# Patient Record
Sex: Male | Born: 1970 | Race: Black or African American | Hispanic: No | Smoking: Current every day smoker
Health system: Southern US, Community
[De-identification: ages and names within clinical notes are randomized; demographics above are authoritative.]

## PROBLEM LIST (undated history)

## (undated) DIAGNOSIS — I10 Essential (primary) hypertension: Secondary | ICD-10-CM

## (undated) HISTORY — PX: OTHER SURGICAL HISTORY: SHX169

---

## 2000-12-20 ENCOUNTER — Inpatient Hospital Stay (HOSPITAL_COMMUNITY): Admission: AC | Admit: 2000-12-20 | Discharge: 2000-12-22 | Payer: Self-pay

## 2001-07-09 ENCOUNTER — Emergency Department (HOSPITAL_COMMUNITY): Admission: EM | Admit: 2001-07-09 | Discharge: 2001-07-10 | Payer: Self-pay | Admitting: Emergency Medicine

## 2001-07-10 ENCOUNTER — Emergency Department (HOSPITAL_COMMUNITY): Admission: EM | Admit: 2001-07-10 | Discharge: 2001-07-10 | Payer: Self-pay | Admitting: Emergency Medicine

## 2006-01-03 ENCOUNTER — Emergency Department (HOSPITAL_COMMUNITY): Admission: EM | Admit: 2006-01-03 | Discharge: 2006-01-03 | Payer: Self-pay | Admitting: Emergency Medicine

## 2006-05-22 ENCOUNTER — Emergency Department (HOSPITAL_COMMUNITY): Admission: EM | Admit: 2006-05-22 | Discharge: 2006-05-22 | Payer: Self-pay | Admitting: Emergency Medicine

## 2006-09-23 ENCOUNTER — Emergency Department (HOSPITAL_COMMUNITY): Admission: EM | Admit: 2006-09-23 | Discharge: 2006-09-23 | Payer: Self-pay | Admitting: Family Medicine

## 2008-03-16 ENCOUNTER — Emergency Department (HOSPITAL_COMMUNITY): Admission: EM | Admit: 2008-03-16 | Discharge: 2008-03-16 | Payer: Self-pay | Admitting: Emergency Medicine

## 2008-05-09 ENCOUNTER — Emergency Department (HOSPITAL_COMMUNITY): Admission: EM | Admit: 2008-05-09 | Discharge: 2008-05-09 | Payer: Self-pay | Admitting: Emergency Medicine

## 2009-04-25 ENCOUNTER — Emergency Department (HOSPITAL_COMMUNITY): Admission: EM | Admit: 2009-04-25 | Discharge: 2009-04-25 | Payer: Self-pay | Admitting: Emergency Medicine

## 2009-10-26 ENCOUNTER — Emergency Department (HOSPITAL_COMMUNITY): Admission: EM | Admit: 2009-10-26 | Discharge: 2009-10-26 | Payer: Self-pay | Admitting: Emergency Medicine

## 2009-11-07 ENCOUNTER — Ambulatory Visit (HOSPITAL_BASED_OUTPATIENT_CLINIC_OR_DEPARTMENT_OTHER): Admission: RE | Admit: 2009-11-07 | Discharge: 2009-11-07 | Payer: Self-pay | Admitting: Orthopedic Surgery

## 2009-11-11 ENCOUNTER — Emergency Department (HOSPITAL_COMMUNITY): Admission: EM | Admit: 2009-11-11 | Discharge: 2009-11-11 | Payer: Self-pay | Admitting: Emergency Medicine

## 2009-11-23 ENCOUNTER — Encounter: Admission: RE | Admit: 2009-11-23 | Discharge: 2010-01-11 | Payer: Self-pay | Admitting: Orthopedic Surgery

## 2010-01-16 ENCOUNTER — Encounter: Admission: RE | Admit: 2010-01-16 | Discharge: 2010-03-26 | Payer: Self-pay | Admitting: Orthopedic Surgery

## 2010-03-23 ENCOUNTER — Emergency Department (HOSPITAL_COMMUNITY): Admission: EM | Admit: 2010-03-23 | Discharge: 2010-03-23 | Payer: Self-pay | Admitting: Emergency Medicine

## 2010-09-13 ENCOUNTER — Emergency Department (HOSPITAL_COMMUNITY)
Admission: EM | Admit: 2010-09-13 | Discharge: 2010-09-13 | Disposition: A | Discharge status: Home / Self Care | Payer: Self-pay | Attending: Emergency Medicine | Admitting: Emergency Medicine

## 2010-09-13 ENCOUNTER — Emergency Department (HOSPITAL_COMMUNITY): Payer: Self-pay

## 2010-09-13 DIAGNOSIS — M25569 Pain in unspecified knee: Secondary | ICD-10-CM | POA: Insufficient documentation

## 2010-09-18 LAB — POCT HEMOGLOBIN-HEMACUE: Hemoglobin: 15.4 g/dL (ref 13.0–17.0)

## 2010-12-24 ENCOUNTER — Emergency Department (HOSPITAL_COMMUNITY)
Admission: EM | Admit: 2010-12-24 | Discharge: 2010-12-24 | Disposition: A | Discharge status: Home / Self Care | Payer: Self-pay | Attending: Emergency Medicine | Admitting: Emergency Medicine

## 2010-12-24 DIAGNOSIS — W540XXA Bitten by dog, initial encounter: Secondary | ICD-10-CM | POA: Insufficient documentation

## 2010-12-24 DIAGNOSIS — S61409A Unspecified open wound of unspecified hand, initial encounter: Secondary | ICD-10-CM | POA: Insufficient documentation

## 2011-01-11 ENCOUNTER — Emergency Department (HOSPITAL_COMMUNITY)
Admission: EM | Admit: 2011-01-11 | Discharge: 2011-01-11 | Disposition: A | Discharge status: Home / Self Care | Payer: Self-pay | Attending: Emergency Medicine | Admitting: Emergency Medicine

## 2011-01-11 DIAGNOSIS — S8010XA Contusion of unspecified lower leg, initial encounter: Secondary | ICD-10-CM | POA: Insufficient documentation

## 2011-01-11 DIAGNOSIS — W208XXA Other cause of strike by thrown, projected or falling object, initial encounter: Secondary | ICD-10-CM | POA: Insufficient documentation

## 2011-01-11 DIAGNOSIS — S81009A Unspecified open wound, unspecified knee, initial encounter: Secondary | ICD-10-CM | POA: Insufficient documentation

## 2011-04-02 LAB — BASIC METABOLIC PANEL
Chloride: 102
GFR calc Af Amer: >60
Potassium: 3.7
Sodium: 140

## 2011-04-02 LAB — URINALYSIS, ROUTINE W REFLEX MICROSCOPIC
Glucose, UA: NEGATIVE
Hgb urine dipstick: NEGATIVE
Protein, ur: NEGATIVE
pH: 6

## 2011-04-02 LAB — DIFFERENTIAL
Eosinophils Absolute: 0
Eosinophils Relative: 0
Lymphs Abs: 1.1
Monocytes Relative: 10

## 2011-04-02 LAB — CBC
HCT: 47.1
MCV: 95.7
RBC: 4.93
WBC: 6.6

## 2012-01-06 ENCOUNTER — Emergency Department (HOSPITAL_COMMUNITY)
Admission: EM | Admit: 2012-01-06 | Discharge: 2012-01-06 | Disposition: A | Discharge status: Home / Self Care | Payer: 59 | Attending: Emergency Medicine | Admitting: Emergency Medicine

## 2012-01-06 ENCOUNTER — Encounter (HOSPITAL_COMMUNITY): Payer: Self-pay | Admitting: *Deleted

## 2012-01-06 DIAGNOSIS — F17200 Nicotine dependence, unspecified, uncomplicated: Secondary | ICD-10-CM | POA: Insufficient documentation

## 2012-01-06 DIAGNOSIS — T7840XA Allergy, unspecified, initial encounter: Secondary | ICD-10-CM

## 2012-01-06 DIAGNOSIS — L272 Dermatitis due to ingested food: Secondary | ICD-10-CM | POA: Insufficient documentation

## 2012-01-06 MED ORDER — FAMOTIDINE 20 MG PO TABS
20.0000 mg | ORAL_TABLET | Freq: Once | ORAL | Status: AC
  Administered 2012-01-06: 20 mg via ORAL
  Filled 2012-01-06: qty 1

## 2012-01-06 MED ORDER — DEXAMETHASONE SODIUM PHOSPHATE 10 MG/ML IJ SOLN
10.0000 mg | Freq: Once | INTRAMUSCULAR | Status: AC
  Administered 2012-01-06: 10 mg via INTRAMUSCULAR
  Filled 2012-01-06: qty 1

## 2012-01-06 MED ORDER — DIPHENHYDRAMINE HCL 50 MG/ML IJ SOLN
25.0000 mg | Freq: Once | INTRAMUSCULAR | Status: AC
  Administered 2012-01-06: 25 mg via INTRAMUSCULAR
  Filled 2012-01-06: qty 1

## 2012-01-06 NOTE — ED Notes (Signed)
Pt states "I ate shrimp last night around 2000, woke up around 0300 with my legs and arms itching";pt denies edema to throat, NAD

## 2012-01-06 NOTE — ED Provider Notes (Signed)
Medical screening examination/treatment/procedure(s) were performed by non-physician practitioner and as supervising physician I was immediately available for consultation/collaboration.  Josuha Fontanez, MD 01/06/12 1555 

## 2012-01-06 NOTE — ED Provider Notes (Signed)
History     CSN: [id number]  Arrival date & time 01/06/12  1321   First MD Initiated Contact with Patient 01/06/12 1353      Chief Complaint  Patient presents with  . Allergic Reaction    (Consider location/radiation/quality/duration/timing/severity/associated sxs/prior treatment) HPI Comments: Patient reports that last evening he ate shrimp.  Shortly after eating the shrimp he felt a "funny feeling" in his throat.  He then woke up at 3 AM this morning and noticed a pruritic rash on both of his arms and both of his legs.  He has put Hydrocortisone on the area, but has not taken anything orally.  The rash has improved somewhat since onset.  He denies any swelling of his throat, lips, or tongue at this time.  No shortness of breath or wheezing.  No difficulty swallowing.    The history is provided by the patient.    History reviewed. No pertinent past medical history.  Past Surgical History  Procedure Date  . Thumb surgery     right    No family history on file.  History  Substance Use Topics  . Smoking status: Current Everyday Smoker -- 0.5 packs/day  . Smokeless tobacco: Not on file  . Alcohol Use: Yes     280 ounces per week      Review of Systems  Constitutional: Negative for fever and chills.  HENT: Negative for facial swelling, trouble swallowing, neck pain and neck stiffness.   Respiratory: Negative for chest tightness, shortness of breath and wheezing.   Cardiovascular: Negative for chest pain.  Gastrointestinal: Negative for nausea and vomiting.  Skin: Positive for rash.  Neurological: Negative for syncope.    Allergies  Review of patient's allergies indicates no known allergies.  Home Medications  No current outpatient prescriptions on file.  BP 116/89  Pulse 96  Temp 97.6 F (36.4 C) (Oral)  Wt 140 lb (63.504 kg)  SpO2 100%  Physical Exam  Nursing note and vitals reviewed. Constitutional: He is oriented to person, place, and time. He appears  well-developed and well-nourished. No distress.  HENT:  Head: Normocephalic and atraumatic.  Mouth/Throat: Oropharynx is clear and moist and mucous membranes are normal.       No sign of airway obstruction. No edema of face, eyelids, lips, tongue, uvula.Marland Kitchen Uvula midline, no nasal congestion or drooling.  Tongue not elevated. No trismus.  Neck: Trachea normal, normal range of motion and full passive range of motion without pain. Neck supple. Carotid bruit is not present. No tracheal deviation present.       No stridor  Cardiovascular: Normal rate, regular rhythm, intact distal pulses and normal pulses.        Not tachycardic  Pulmonary/Chest: Effort normal. No stridor.  Musculoskeletal: Normal range of motion.  Neurological: He is alert and oriented to person, place, and time.  Skin: Skin is warm and intact. Rash noted. No petechiae and no purpura noted. He is not diaphoretic.       Not diaphoretic. Raised erythematous welts, pruritic in nature, located on both legs. Blanchable urticaria, no petechiae or purpura.   Psychiatric: He has a normal mood and affect. His behavior is normal.    ED Course  Procedures (including critical care time)  Labs Reviewed - No data to display No results found.   1. Allergic reaction       MDM  Patient given Benadryl, Pepcid, and Decadron while in the ED.  Patient re-evaluated prior to dc, is hemodynamically stable,  in no respiratory distress, and denies the feeling of throat closing. Pt has been advised to take OTC benadryl & return to the ED if they have a mod-severe allergic rxn (s/s including throat closing, difficulty breathing, swelling of lips face or tongue). Pt is to follow up with their PCP. Pt is agreeable with plan & verbalizes understanding.        Pascal Lux [place], PA-C 01/06/12 [phone]

## 2012-01-06 NOTE — ED Notes (Addendum)
Pt states he ate shrimp yesterday and began itching soon after.  Pt denies previous h/o allergy to shrimp or shellfish.  Pt states itching won't go away.  Pt in no resp distress or compromise at this time.

## 2012-06-22 ENCOUNTER — Encounter (HOSPITAL_COMMUNITY): Payer: Self-pay | Admitting: *Deleted

## 2012-06-22 ENCOUNTER — Emergency Department (HOSPITAL_COMMUNITY)
Admission: EM | Admit: 2012-06-22 | Discharge: 2012-06-22 | Disposition: A | Discharge status: Home / Self Care | Payer: No Typology Code available for payment source | Attending: Emergency Medicine | Admitting: Emergency Medicine

## 2012-06-22 DIAGNOSIS — Y92410 Unspecified street and highway as the place of occurrence of the external cause: Secondary | ICD-10-CM | POA: Insufficient documentation

## 2012-06-22 DIAGNOSIS — F17200 Nicotine dependence, unspecified, uncomplicated: Secondary | ICD-10-CM | POA: Insufficient documentation

## 2012-06-22 DIAGNOSIS — S59909A Unspecified injury of unspecified elbow, initial encounter: Secondary | ICD-10-CM | POA: Insufficient documentation

## 2012-06-22 DIAGNOSIS — S139XXA Sprain of joints and ligaments of unspecified parts of neck, initial encounter: Secondary | ICD-10-CM | POA: Insufficient documentation

## 2012-06-22 DIAGNOSIS — S59919A Unspecified injury of unspecified forearm, initial encounter: Secondary | ICD-10-CM | POA: Insufficient documentation

## 2012-06-22 DIAGNOSIS — S339XXA Sprain of unspecified parts of lumbar spine and pelvis, initial encounter: Secondary | ICD-10-CM | POA: Insufficient documentation

## 2012-06-22 DIAGNOSIS — S161XXA Strain of muscle, fascia and tendon at neck level, initial encounter: Secondary | ICD-10-CM

## 2012-06-22 DIAGNOSIS — S6990XA Unspecified injury of unspecified wrist, hand and finger(s), initial encounter: Secondary | ICD-10-CM | POA: Insufficient documentation

## 2012-06-22 DIAGNOSIS — S39012A Strain of muscle, fascia and tendon of lower back, initial encounter: Secondary | ICD-10-CM

## 2012-06-22 DIAGNOSIS — Y9389 Activity, other specified: Secondary | ICD-10-CM | POA: Insufficient documentation

## 2012-06-22 MED ORDER — IBUPROFEN 800 MG PO TABS: 800.0000 mg | ORAL_TABLET | Freq: Three times a day (TID) | ORAL | Status: DC | PRN

## 2012-06-22 MED ORDER — CYCLOBENZAPRINE HCL 10 MG PO TABS: 10.0000 mg | ORAL_TABLET | Freq: Three times a day (TID) | ORAL | Status: DC | PRN

## 2012-06-22 MED ORDER — OXYCODONE-ACETAMINOPHEN 5-325 MG PO TABS: 1.0000 | ORAL_TABLET | Freq: Four times a day (QID) | ORAL | Status: DC | PRN
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
## 2012-09-28 ENCOUNTER — Emergency Department (HOSPITAL_COMMUNITY)
Admission: EM | Admit: 2012-09-28 | Discharge: 2012-09-28 | Disposition: A | Discharge status: Home / Self Care | Payer: 59 | Attending: Emergency Medicine | Admitting: Emergency Medicine

## 2012-09-28 ENCOUNTER — Other Ambulatory Visit: Payer: Self-pay

## 2012-09-28 ENCOUNTER — Encounter (HOSPITAL_COMMUNITY): Payer: Self-pay | Admitting: Emergency Medicine

## 2012-09-28 ENCOUNTER — Emergency Department (HOSPITAL_COMMUNITY): Payer: 59

## 2012-09-28 DIAGNOSIS — I10 Essential (primary) hypertension: Secondary | ICD-10-CM

## 2012-09-28 DIAGNOSIS — R059 Cough, unspecified: Secondary | ICD-10-CM | POA: Insufficient documentation

## 2012-09-28 DIAGNOSIS — Z79899 Other long term (current) drug therapy: Secondary | ICD-10-CM | POA: Insufficient documentation

## 2012-09-28 DIAGNOSIS — R197 Diarrhea, unspecified: Secondary | ICD-10-CM | POA: Insufficient documentation

## 2012-09-28 DIAGNOSIS — F17200 Nicotine dependence, unspecified, uncomplicated: Secondary | ICD-10-CM | POA: Insufficient documentation

## 2012-09-28 DIAGNOSIS — R062 Wheezing: Secondary | ICD-10-CM | POA: Insufficient documentation

## 2012-09-28 DIAGNOSIS — R05 Cough: Secondary | ICD-10-CM | POA: Insufficient documentation

## 2012-09-28 DIAGNOSIS — R1110 Vomiting, unspecified: Secondary | ICD-10-CM | POA: Insufficient documentation

## 2012-09-28 LAB — CBC WITH DIFFERENTIAL/PLATELET
Basophils Absolute: 0 10*3/uL (ref 0.0–0.1)
Eosinophils Relative: 1 % (ref 0–5)
HCT: 46.5 % (ref 39.0–52.0)
Hemoglobin: 16.3 g/dL (ref 13.0–17.0)
Lymphocytes Relative: 31 % (ref 12–46)
MCV: 90.6 fL (ref 78.0–100.0)
Monocytes Absolute: 0.8 10*3/uL (ref 0.1–1.0)
Monocytes Relative: 13 % — ABNORMAL HIGH (ref 3–12)
RDW: 13.5 % (ref 11.5–15.5)
WBC: 5.9 10*3/uL (ref 4.0–10.5)

## 2012-09-28 LAB — URINALYSIS, MICROSCOPIC ONLY
Leukocytes, UA: NEGATIVE
Nitrite: NEGATIVE
Protein, ur: NEGATIVE mg/dL
Specific Gravity, Urine: 1.025 (ref 1.005–1.030)
Urobilinogen, UA: 1 mg/dL (ref 0.0–1.0)

## 2012-09-28 LAB — COMPREHENSIVE METABOLIC PANEL
AST: 92 U/L — ABNORMAL HIGH (ref 0–37)
BUN: 8 mg/dL (ref 6–23)
CO2: 28 mEq/L (ref 19–32)
Calcium: 9.4 mg/dL (ref 8.4–10.5)
Creatinine, Ser: 1.01 mg/dL (ref 0.50–1.35)
GFR calc Af Amer: >90 mL/min (ref >90)
GFR calc non Af Amer: >90 mL/min (ref >90)
Glucose, Bld: 102 mg/dL — ABNORMAL HIGH (ref 70–99)
Total Bilirubin: 0.9 mg/dL (ref 0.3–1.2)

## 2012-09-28 MED ORDER — HYDROCHLOROTHIAZIDE 25 MG PO TABS: 25.0000 mg | ORAL_TABLET | Freq: Every day | ORAL | Status: DC

## 2012-09-28 MED ORDER — ZOLPIDEM TARTRATE 10 MG PO TABS: ORAL_TABLET | ORAL | Status: DC

## 2012-09-28 MED ORDER — SODIUM CHLORIDE 0.9 % IV BOLUS (SEPSIS)
500.0000 mL | Freq: Once | INTRAVENOUS | Status: AC
  Administered 2012-09-28: 500 mL via INTRAVENOUS

## 2012-09-28 MED ORDER — HYDROCOD POLST-CHLORPHEN POLST 10-8 MG/5ML PO LQCR: 5.0000 mL | Freq: Two times a day (BID) | ORAL | Status: DC | PRN

## 2012-09-28 NOTE — ED Notes (Signed)
Pt complains of cough and shortness of breath x 3 days. Pt denies fever at this time. Pt also complains of vomiting and diarrhea x 3 days.

## 2012-09-28 NOTE — Progress Notes (Signed)
[facility] ED CM noted pt with coverage but no pcp listed [facility] ED CM spoke with pt on how to obtain an in network pcp with insurance coverage via the customer service number or web site   

## 2012-09-28 NOTE — ED Provider Notes (Signed)
History     CSN: [id number]  Arrival date & time 09/28/12  1109   First MD Initiated Contact with Patient 09/28/12 1158      Chief Complaint  Patient presents with  . Shortness of Breath    HPI Pt complains of cough and shortness of breath x 3 days. Pt denies fever at this time. Pt also complains of vomiting and diarrhea x 3 days.  History reviewed. No pertinent past medical history.  Past Surgical History  Procedure Laterality Date  . Thumb surgery      right    No family history on file.  History  Substance Use Topics  . Smoking status: Current Every Day Smoker -- 0.50 packs/day  . Smokeless tobacco: Not on file  . Alcohol Use: Yes     Comment: 280 ounces per week/  today states occasionally      Review of Systems All other systems reviewed and are negative Allergies  Review of patient's allergies indicates no known allergies.  Home Medications   Current Outpatient Rx  Name  Route  Sig  Dispense  Refill  . chlorpheniramine-HYDROcodone (TUSSIONEX PENNKINETIC ER) 10-8 MG/5ML LQCR   Oral   Take 5 mLs by mouth every 12 (twelve) hours as needed.   140 mL   0   . cyclobenzaprine (FLEXERIL) 10 MG tablet   Oral   Take 1 tablet (10 mg total) by mouth 3 (three) times daily as needed for muscle spasms.   30 tablet   0   . hydrochlorothiazide (HYDRODIURIL) 25 MG tablet   Oral   Take 1 tablet (25 mg total) by mouth daily.   30 tablet   0   . ibuprofen (ADVIL,MOTRIN) 800 MG tablet   Oral   Take 800 mg by mouth every 8 (eight) hours as needed. For knee pain.         Marland Kitchen ibuprofen (ADVIL,MOTRIN) 800 MG tablet   Oral   Take 1 tablet (800 mg total) by mouth every 8 (eight) hours as needed for pain.   30 tablet   0   . oxyCODONE-acetaminophen (PERCOCET/ROXICET) 5-325 MG per tablet   Oral   Take 1-2 tablets by mouth every 6 (six) hours as needed for pain.   20 tablet   0   . zolpidem (AMBIEN) 10 MG tablet      Take 1/2-1 tablet at bedtime as needed for  sleep   30 tablet   0     BP 136/99  Pulse 97  Temp(Src) 98.6 F (37 C) (Oral)  Resp 18  SpO2 99%  Physical Exam  Nursing note and vitals reviewed. Constitutional: He is oriented to person, place, and time. He appears well-developed and well-nourished. No distress.  HENT:  Head: Normocephalic and atraumatic.  Eyes: Pupils are equal, round, and reactive to light.  Neck: Normal range of motion.  Cardiovascular: Normal rate and intact distal pulses.   Pulmonary/Chest: No respiratory distress. He has wheezes.  Abdominal: Normal appearance. He exhibits no distension. There is no tenderness. There is no rebound.  Musculoskeletal: Normal range of motion.  Neurological: He is alert and oriented to person, place, and time. No cranial nerve deficit.  Skin: Skin is warm and dry. No rash noted.  Psychiatric: He has a normal mood and affect. His behavior is normal.    ED Course  Procedures (including critical care time)  Date: 09/28/2012  Rate: 89  Rhythm: normal sinus rhythm  QRS Axis: normal  Intervals: normal  ST/T Wave abnormalities: ST changes consistent with early recall  Conduction Disutrbances: none  Narrative Interpretation: unremarkable     Labs Reviewed  CBC WITH DIFFERENTIAL - Abnormal; Notable for the following:    Monocytes Relative 13 (*)    All other components within normal limits  COMPREHENSIVE METABOLIC PANEL - Abnormal; Notable for the following:    Chloride 95 (*)    Glucose, Bld 102 (*)    AST 92 (*)    All other components within normal limits  URINALYSIS, MICROSCOPIC ONLY - Abnormal; Notable for the following:    Color, Urine AMBER (*)    All other components within normal limits  ETHANOL  TROPONIN I   Dg Chest 2 View  09/28/2012  *RADIOLOGY REPORT*  Clinical Data: Palpitations  CHEST - 2 VIEW  Comparison: May 09, 2008.  Findings: Cardiomediastinal silhouette appears normal.  No acute pulmonary disease is noted.  Bony thorax is intact.   IMPRESSION: No acute cardiopulmonary abnormality seen.   Original Report Authenticated By: Lupita Raider.,  M.D.      1. Hypertension   2. Vomiting and diarrhea       MDM         Nelia Shi, MD 09/28/12 1530
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
## 2012-10-07 ENCOUNTER — Institutional Professional Consult (permissible substitution): Payer: 59 | Admitting: Cardiology

## 2013-10-25 ENCOUNTER — Ambulatory Visit: Payer: Self-pay | Admitting: Family Medicine

## 2015-03-04 ENCOUNTER — Encounter (HOSPITAL_COMMUNITY): Payer: Self-pay

## 2015-03-04 ENCOUNTER — Emergency Department (HOSPITAL_COMMUNITY)
Admission: EM | Admit: 2015-03-04 | Discharge: 2015-03-04 | Disposition: A | Discharge status: Home / Self Care | Payer: Self-pay | Attending: Emergency Medicine | Admitting: Emergency Medicine

## 2015-03-04 DIAGNOSIS — Y9389 Activity, other specified: Secondary | ICD-10-CM | POA: Insufficient documentation

## 2015-03-04 DIAGNOSIS — S0182XA Laceration with foreign body of other part of head, initial encounter: Secondary | ICD-10-CM | POA: Insufficient documentation

## 2015-03-04 DIAGNOSIS — Y9289 Other specified places as the place of occurrence of the external cause: Secondary | ICD-10-CM | POA: Insufficient documentation

## 2015-03-04 DIAGNOSIS — IMO0002 Reserved for concepts with insufficient information to code with codable children: Secondary | ICD-10-CM

## 2015-03-04 DIAGNOSIS — W01198A Fall on same level from slipping, tripping and stumbling with subsequent striking against other object, initial encounter: Secondary | ICD-10-CM | POA: Insufficient documentation

## 2015-03-04 DIAGNOSIS — Y998 Other external cause status: Secondary | ICD-10-CM | POA: Insufficient documentation

## 2015-03-04 DIAGNOSIS — F10120 Alcohol abuse with intoxication, uncomplicated: Secondary | ICD-10-CM | POA: Insufficient documentation

## 2015-03-04 DIAGNOSIS — Z23 Encounter for immunization: Secondary | ICD-10-CM | POA: Insufficient documentation

## 2015-03-04 DIAGNOSIS — Z720 Tobacco use: Secondary | ICD-10-CM | POA: Insufficient documentation

## 2015-03-04 DIAGNOSIS — S0990XA Unspecified injury of head, initial encounter: Secondary | ICD-10-CM

## 2015-03-04 MED ORDER — TETANUS-DIPHTH-ACELL PERTUSSIS 5-2.5-18.5 LF-MCG/0.5 IM SUSP
0.5000 mL | Freq: Once | INTRAMUSCULAR | Status: AC
  Administered 2015-03-04: 0.5 mL via INTRAMUSCULAR
  Filled 2015-03-04: qty 0.5

## 2015-03-04 MED ORDER — LIDOCAINE-EPINEPHRINE 2 %-1:100000 IJ SOLN
20.0000 mL | Freq: Once | INTRAMUSCULAR | Status: AC
  Administered 2015-03-04: 20 mL
  Filled 2015-03-04: qty 1

## 2015-03-04 MED ORDER — SODIUM CHLORIDE 0.9 % IV BOLUS (SEPSIS)
2000.0000 mL | Freq: Once | INTRAVENOUS | Status: AC
  Administered 2015-03-04: 2000 mL via INTRAVENOUS
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
## 2015-03-04 NOTE — ED Notes (Signed)
Pt is a&ox4 and ambulatory. Pt denied questions r/t dc

## 2015-03-04 NOTE — ED Notes (Signed)
He remains in no distress; and his head lac. Has ceased to bleed.
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
## 2015-03-04 NOTE — ED Notes (Signed)
Dr. Clayborne Dana has just placed 4 staples in his wound after infiltrating it with lidocaine.  Pt. Tol. Franco.
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
## 2015-03-14 ENCOUNTER — Emergency Department (HOSPITAL_COMMUNITY)
Admission: EM | Admit: 2015-03-14 | Discharge: 2015-03-14 | Disposition: A | Discharge status: Home / Self Care | Payer: 59 | Attending: Emergency Medicine | Admitting: Emergency Medicine

## 2015-03-14 ENCOUNTER — Encounter (HOSPITAL_COMMUNITY): Payer: Self-pay | Admitting: Emergency Medicine

## 2015-03-14 DIAGNOSIS — Z720 Tobacco use: Secondary | ICD-10-CM | POA: Diagnosis not present

## 2015-03-14 DIAGNOSIS — Z4802 Encounter for removal of sutures: Secondary | ICD-10-CM | POA: Diagnosis not present

## 2015-03-14 NOTE — ED Notes (Signed)
Pt here to get staples removed from posterior head that he got placed last week. Pt denies any complications with the staples. No redness noted at site.

## 2015-03-14 NOTE — ED Notes (Signed)
Awake. Verbally responsive. A/O x4. Resp even and unlabored. No audible adventitious breath sounds noted. ABC's intact.  

## 2015-03-14 NOTE — Discharge Instructions (Signed)
Suture Removal, Care After °Refer to this sheet in the next few weeks. These instructions provide you with information on caring for yourself after your procedure. Your health care provider may also give you more specific instructions. Your treatment has been planned according to current medical practices, but problems sometimes occur. Call your health care provider if you have any problems or questions after your procedure. °WHAT TO EXPECT AFTER THE PROCEDURE °After your stitches (sutures) are removed, it is typical to have the following: °· Some discomfort and swelling in the wound area. °· Slight redness in the area. °HOME CARE INSTRUCTIONS  °· If you have skin adhesive strips over the wound area, do not take the strips off. They will fall off on their own in a few days. If the strips remain in place after 14 days, you may remove them. °· Change any bandages (dressings) at least once a day or as directed by your health care provider. If the bandage sticks, soak it off with warm, soapy water. °· Apply cream or ointment only as directed by your health care provider. If using cream or ointment, wash the area with soap and water 2 times a day to remove all the cream or ointment. Rinse off the soap and pat the area dry with a clean towel. °· Keep the wound area dry and clean. If the bandage becomes wet or dirty, or if it develops a bad smell, change it as soon as possible. °· Continue to protect the wound from injury. °· Use sunscreen when out in the sun. New scars become sunburned easily. °SEEK MEDICAL CARE IF: °· You have increasing redness, swelling, or pain in the wound. °· You see pus coming from the wound. °· You have a fever. °· You notice a bad smell coming from the wound or dressing. °· Your wound breaks open (edges not staying together). °Document Released: 03/12/2001 Document Revised: 04/07/2013 Document Reviewed: 01/27/2013 °ExitCare® Patient Information ©2015 [employer]. This information is not  intended to replace advice given to you by your health care provider. Make sure you discuss any questions you have with your health care provider. ° °Scar Minimization °You will have a scar anytime you have surgery and a cut is made in the skin or you have something removed from your skin (mole, skin cancer, cyst). Although scars are unavoidable following surgery, there are ways to minimize their appearance. °It is important to follow all the instructions you receive from your caregiver about wound care. How your wound heals will influence the appearance of your scar. If you do not follow the wound care instructions as directed, complications such as infection may occur. Wound instructions include keeping the wound clean, moist, and not letting the wound form a scab. Some people form scars that are raised and lumpy (hypertrophic) or larger than the initial wound (keloidal). °HOME CARE INSTRUCTIONS  °· Follow wound care instructions as directed. °· Keep the wound clean by washing it with soap and water. °· Keep the wound moist with provided antibiotic cream or petroleum jelly until completely healed. Moisten twice a day for about 2 weeks. °· Get stitches (sutures) taken out at the scheduled time. °· Avoid touching or manipulating your wound unless needed. Wash your hands thoroughly before and after touching your wound. °· Follow all restrictions such as limits on exercise or work. This depends on where your scar is located. °· Keep the scar protected from sunburn. Cover the scar with sunscreen/sunblock with SPF 30 or higher. °·   Gently massage the scar using a circular motion to help minimize the appearance of the scar. Do this only after the wound has closed and all the sutures have been removed.  For hypertrophic or keloidal scars, there are several ways to treat and minimize their appearance. Methods include compression therapy, intralesional corticosteroids, laser therapy, or surgery. These methods are performed  by your caregiver. Remember that the scar may appear lighter or darker than your normal skin color. This difference in color should even out with time. SEEK MEDICAL CARE IF:   You have a fever.  You develop signs of infection such as pain, redness, pus, and warmth.  You have questions or concerns. Document Released: 12/05/2009 Document Revised: 09/09/2011 Document Reviewed: 12/05/2009 [facility] Patient Information 2015 [place], [place]. This information is not intended to replace advice given to you by your health care provider. Make sure you discuss any questions you have with your health care provider. DASH Eating Plan DASH stands for "Dietary Approaches to Stop Hypertension." The DASH eating plan is a healthy eating plan that has been shown to reduce high blood pressure (hypertension). Additional health benefits may include reducing the risk of type 2 diabetes mellitus, heart disease, and stroke. The DASH eating plan may also help with weight loss. WHAT DO I NEED TO KNOW ABOUT THE DASH EATING PLAN? For the DASH eating plan, you will follow these general guidelines:  Choose foods with a percent daily value for sodium of less than 5% (as listed on the food label).  Use salt-free seasonings or herbs instead of table salt or sea salt.  Check with your health care provider or pharmacist before using salt substitutes.  Eat lower-sodium products, often labeled as "lower sodium" or "no salt added."  Eat fresh foods.  Eat more vegetables, fruits, and low-fat dairy products.  Choose whole grains. Look for the word "whole" as the first word in the ingredient list.  Choose fish and skinless chicken or [place] more often than red meat. Limit fish, poultry, and meat to 6 oz (170 g) each day.  Limit sweets, desserts, sugars, and sugary drinks.  Choose heart-healthy fats.  Limit cheese to 1 oz (28 g) per day.  Eat more home-cooked food and less restaurant, buffet, and fast food.  Limit fried  foods.  Cook foods using methods other than frying.  Limit canned vegetables. If you do use them, rinse them well to decrease the sodium.  When eating at a restaurant, ask that your food be prepared with less salt, or no salt if possible. WHAT FOODS CAN I EAT? Seek help from a dietitian for individual calorie needs. Grains Whole grain or whole wheat bread. Brown rice. Whole grain or whole wheat pasta. Quinoa, bulgur, and whole grain cereals. Low-sodium cereals. Corn or whole wheat flour tortillas. Whole grain cornbread. Whole grain crackers. Low-sodium crackers. Vegetables Fresh or frozen vegetables (raw, steamed, roasted, or grilled). Low-sodium or reduced-sodium tomato and vegetable juices. Low-sodium or reduced-sodium tomato sauce and paste. Low-sodium or reduced-sodium canned vegetables.  Fruits All fresh, canned (in natural juice), or frozen fruits. Meat and Other Protein Products Ground beef (85% or leaner), grass-fed beef, or beef trimmed of fat. Skinless chicken or [place]. Ground chicken or [place]. Pork trimmed of fat. All fish and seafood. Eggs. Dried beans, peas, or lentils. Unsalted nuts and seeds. Unsalted canned beans. Dairy Low-fat dairy products, such as skim or 1% milk, 2% or reduced-fat cheeses, low-fat ricotta or cottage cheese, or plain low-fat yogurt. Low-sodium or reduced-sodium  cheeses. Fats and Oils Tub margarines without trans fats. Light or reduced-fat mayonnaise and salad dressings (reduced sodium). Avocado. Safflower, olive, or canola oils. Natural peanut or almond butter. Other Unsalted popcorn and pretzels. The items listed above may not be a complete list of recommended foods or beverages. Contact your dietitian for more options. WHAT FOODS ARE NOT RECOMMENDED? Grains White bread. White pasta. White rice. Refined cornbread. Bagels and croissants. Crackers that contain trans fat. Vegetables Creamed or fried vegetables. Vegetables in a cheese sauce. Regular  canned vegetables. Regular canned tomato sauce and paste. Regular tomato and vegetable juices. Fruits Dried fruits. Canned fruit in light or heavy syrup. Fruit juice. Meat and Other Protein Products Fatty cuts of meat. Ribs, chicken wings, bacon, sausage, bologna, salami, chitterlings, fatback, hot dogs, bratwurst, and packaged luncheon meats. Salted nuts and seeds. Canned beans with salt. Dairy Whole or 2% milk, cream, half-and-half, and cream cheese. Whole-fat or sweetened yogurt. Full-fat cheeses or blue cheese. Nondairy creamers and whipped toppings. Processed cheese, cheese spreads, or cheese curds. Condiments Onion and garlic salt, seasoned salt, table salt, and sea salt. Canned and packaged gravies. Worcestershire sauce. Tartar sauce. Barbecue sauce. Teriyaki sauce. Soy sauce, including reduced sodium. Steak sauce. Fish sauce. Oyster sauce. Cocktail sauce. Horseradish. Ketchup and mustard. Meat flavorings and tenderizers. Bouillon cubes. Hot sauce. Tabasco sauce. Marinades. Taco seasonings. Relishes. Fats and Oils Butter, stick margarine, lard, shortening, ghee, and bacon fat. Coconut, palm kernel, or palm oils. Regular salad dressings. Other Pickles and olives. Salted popcorn and pretzels. The items listed above may not be a complete list of foods and beverages to avoid. Contact your dietitian for more information. WHERE CAN I FIND MORE INFORMATION? National Heart, Lung, and Blood Institute: [employer] Document Released: 06/06/2011 Document Revised: 11/01/2013 Document Reviewed: 04/21/2013 [facility] Patient Information 2015 [place], [place]. This information is not intended to replace advice given to you by your health care provider. Make sure you discuss any questions you have with your health care provider.

## 2015-03-14 NOTE — ED Provider Notes (Signed)
CSN: [id number]     Arrival date & time 03/14/15  1753 History  This chart was scribed for non-physician practitioner Everlene Farrier, PA-C, working with Eber Hong, MD, by Tanda Rockers, ED Scribe. This patient was seen in room WTR5/WTR5 and the patient's care was started at 6:18 PM.  Chief Complaint  Patient presents with  . Suture / Staple Removal   The history is provided by the patient. No language interpreter was used.     HPI Comments: Garrett Franco is a 44 y.o. male who presents to the Emergency Department for staple removed from occipital region. Pt notes mild tenderness to the area but is otherwise complaint free. He was seen in the ED on  03/04/2015 (approximately 10 days ago) for head laceration after falling backwards and hitting his head on a TV. Pt had 4 staples placed at that time. Denies headache, fever, chills, discharge, or any other associated symptoms.    History reviewed. No pertinent past medical history. Past Surgical History  Procedure Laterality Date  . Thumb surgery      right   No family history on file. Social History  Substance Use Topics  . Smoking status: Current Every Day Smoker -- 0.50 packs/day  . Smokeless tobacco: None  . Alcohol Use: Yes     Comment: 280 ounces per week/  today states occasionally    Review of Systems  Constitutional: Negative for fever and chills.  Eyes: Negative for visual disturbance.  Skin: Positive for wound (Laceration to occiput).  Neurological: Negative for headaches.   Allergies  Review of patient's allergies indicates no known allergies.  Home Medications   Prior to Admission medications   Medication Sig Start Date End Date Taking? Authorizing Provider  hydrochlorothiazide (HYDRODIURIL) 25 MG tablet Take 1 tablet (25 mg total) by mouth daily. Patient not taking: Reported on 03/04/2015 09/28/12   Nelva Nay, MD  zolpidem (AMBIEN) 10 MG tablet Take 1/2-1 tablet at bedtime as needed for sleep Patient not  taking: Reported on 03/04/2015 09/28/12   Nelva Nay, MD   Triage Vitals: BP 143/90 mmHg  Pulse 85  Temp(Src) 98.7 F (37.1 C) (Oral)  Resp 18  SpO2 100%   Physical Exam  Constitutional: He is oriented to person, place, and time. He appears well-developed and well-nourished. No distress.  Nontoxic appearing.  HENT:  Head: Normocephalic and atraumatic.  Eyes: Right eye exhibits no discharge. Left eye exhibits no discharge.  Pulmonary/Chest: Effort normal. No respiratory distress.  Neurological: He is alert and oriented to person, place, and time. Coordination normal.  Skin: Skin is warm and dry. No rash noted. He is not diaphoretic. No erythema. No pallor.  Patient has 4 staples placed to his posterior head. His laceration is well-healed. No overlying or surrounding erythema. No warmth or discharge noted.  Psychiatric: He has a normal mood and affect. His behavior is normal.  Nursing note and vitals reviewed.   ED Course  SUTURE REMOVAL Date/Time: 03/14/2015 6:23 PM Performed by: Everlene Farrier Authorized by: Everlene Farrier Consent: Verbal consent obtained. Risks and benefits: risks, benefits and alternatives were discussed Consent given by: patient Patient understanding: patient states understanding of the procedure being performed Patient consent: the patient's understanding of the procedure matches consent given Site marked: the operative site was marked Required items: required blood products, implants, devices, and special equipment available Patient identity confirmed: verbally with patient Time out: Immediately prior to procedure a "time out" was called to verify the correct patient, procedure, equipment, support  staff and site/side marked as required. Wound Appearance: clean Sutures Removed: 0 Staples Removed: 4 Facility: sutures placed in this facility Patient tolerance: Patient tolerated the procedure well with no immediate complications   (including critical care  time)  DIAGNOSTIC STUDIES: Oxygen Saturation is 100% on RA, normal by my interpretation.    COORDINATION OF CARE: 6:24 PM-Discussed treatment plan which includes referral to [place] and wellness to follow up for his blood pressure with pt at bedside and pt agreed to plan.   Labs Review Labs Reviewed - No data to display  Imaging Review No results found.    EKG Interpretation None      Filed Vitals:   03/14/15 1758 03/14/15 1800  BP: 143/90   Pulse: 85   Temp: 98.7 F (37.1 C)   TempSrc: Oral   Resp: 18   SpO2: 100% 100%     MDM   Meds given in ED:  Medications - No data to display  New Prescriptions   No medications on file    Final diagnoses:  Encounter for staple removal   This is a 44 year old male who presents to the emergency department to have his staples removed from his posterior head after they were placed 10 days ago in the emergency department. The patient has 4 staples placed his posterior head. The incision is well-healed and clean and dry. No discharge, or erythema noted to the site. Staples removed by me and tolerated well by the patient. I encouraged patient to make an appointment for follow-up with a primary care provider to have his blood pressure rechecked. I advised the patient to follow-up with their primary care provider this week. I advised the patient to return to the emergency department with new or worsening symptoms or new concerns. The patient verbalized understanding and agreement with plan.    I personally performed the services described in this documentation, which was scribed in my presence. The recorded information has been reviewed and is accurate.         Everlene Farrier, PA-C 03/14/15 1829  Eber Hong, MD 03/16/15 [phone]

## 2015-07-10 ENCOUNTER — Emergency Department (HOSPITAL_COMMUNITY): Payer: 59

## 2015-07-10 ENCOUNTER — Emergency Department (HOSPITAL_COMMUNITY)
Admission: EM | Admit: 2015-07-10 | Discharge: 2015-07-10 | Disposition: A | Discharge status: Home / Self Care | Payer: 59 | Attending: Emergency Medicine | Admitting: Emergency Medicine

## 2015-07-10 ENCOUNTER — Encounter (HOSPITAL_COMMUNITY): Payer: Self-pay | Admitting: Family Medicine

## 2015-07-10 DIAGNOSIS — W01198A Fall on same level from slipping, tripping and stumbling with subsequent striking against other object, initial encounter: Secondary | ICD-10-CM | POA: Diagnosis not present

## 2015-07-10 DIAGNOSIS — I10 Essential (primary) hypertension: Secondary | ICD-10-CM | POA: Insufficient documentation

## 2015-07-10 DIAGNOSIS — S6991XA Unspecified injury of right wrist, hand and finger(s), initial encounter: Secondary | ICD-10-CM | POA: Diagnosis present

## 2015-07-10 DIAGNOSIS — S62514A Nondisplaced fracture of proximal phalanx of right thumb, initial encounter for closed fracture: Secondary | ICD-10-CM | POA: Insufficient documentation

## 2015-07-10 DIAGNOSIS — S5291XA Unspecified fracture of right forearm, initial encounter for closed fracture: Secondary | ICD-10-CM

## 2015-07-10 DIAGNOSIS — S52614A Nondisplaced fracture of right ulna styloid process, initial encounter for closed fracture: Secondary | ICD-10-CM | POA: Insufficient documentation

## 2015-07-10 DIAGNOSIS — S0990XA Unspecified injury of head, initial encounter: Secondary | ICD-10-CM | POA: Diagnosis not present

## 2015-07-10 DIAGNOSIS — Y998 Other external cause status: Secondary | ICD-10-CM | POA: Insufficient documentation

## 2015-07-10 DIAGNOSIS — S52611A Displaced fracture of right ulna styloid process, initial encounter for closed fracture: Secondary | ICD-10-CM

## 2015-07-10 DIAGNOSIS — Z79899 Other long term (current) drug therapy: Secondary | ICD-10-CM | POA: Insufficient documentation

## 2015-07-10 DIAGNOSIS — F17200 Nicotine dependence, unspecified, uncomplicated: Secondary | ICD-10-CM | POA: Diagnosis not present

## 2015-07-10 DIAGNOSIS — W19XXXA Unspecified fall, initial encounter: Secondary | ICD-10-CM

## 2015-07-10 DIAGNOSIS — Y9289 Other specified places as the place of occurrence of the external cause: Secondary | ICD-10-CM | POA: Insufficient documentation

## 2015-07-10 DIAGNOSIS — Y9389 Activity, other specified: Secondary | ICD-10-CM | POA: Insufficient documentation

## 2015-07-10 HISTORY — DX: Essential (primary) hypertension: I10

## 2015-07-10 MED ORDER — HYDROMORPHONE HCL 1 MG/ML IJ SOLN
1.0000 mg | Freq: Once | INTRAMUSCULAR | Status: AC
  Administered 2015-07-10: 1 mg via INTRAVENOUS
  Filled 2015-07-10: qty 1

## 2015-07-10 MED ORDER — ONDANSETRON HCL 4 MG PO TABS: 4.0000 mg | ORAL_TABLET | Freq: Four times a day (QID) | ORAL | Status: DC

## 2015-07-10 MED ORDER — ONDANSETRON HCL 4 MG/2ML IJ SOLN
4.0000 mg | Freq: Once | INTRAMUSCULAR | Status: AC
  Administered 2015-07-10: 4 mg via INTRAVENOUS
  Filled 2015-07-10: qty 2

## 2015-07-10 MED ORDER — HYDROCODONE-ACETAMINOPHEN 5-325 MG PO TABS
1.0000 | ORAL_TABLET | Freq: Once | ORAL | Status: AC
  Administered 2015-07-10: 1 via ORAL
  Filled 2015-07-10: qty 1

## 2015-07-10 MED ORDER — HYDROCODONE-ACETAMINOPHEN 5-325 MG PO TABS: 1.0000 | ORAL_TABLET | ORAL | Status: DC | PRN

## 2015-07-10 NOTE — Discharge Instructions (Signed)
Cast or Splint Care °Casts and splints support injured limbs and keep bones from moving while they heal. It is important to care for your cast or splint at home.   °HOME CARE INSTRUCTIONS °· Keep the cast or splint uncovered during the drying period. It can take 24 to 48 hours to dry if it is made of plaster. A fiberglass cast will dry in less than 1 hour. °· Do not rest the cast on anything harder than a pillow for the first 24 hours. °· Do not put weight on your injured limb or apply pressure to the cast until your health care provider gives you permission. °· Keep the cast or splint dry. Wet casts or splints can lose their shape and may not support the limb as well. A wet cast that has lost its shape can also create harmful pressure on your skin when it dries. Also, wet skin can become infected. °· Cover the cast or splint with a plastic bag when bathing or when out in the rain or snow. If the cast is on the trunk of the body, take sponge baths until the cast is removed. °· If your cast does become wet, dry it with a towel or a blow dryer on the cool setting only. °· Keep your cast or splint clean. Soiled casts may be wiped with a moistened cloth. °· Do not place any hard or soft foreign objects under your cast or splint, such as cotton, toilet paper, lotion, or powder. °· Do not try to scratch the skin under the cast with any object. The object could get stuck inside the cast. Also, scratching could lead to an infection. If itching is a problem, use a blow dryer on a cool setting to relieve discomfort. °· Do not trim or cut your cast or remove padding from inside of it. °· Exercise all joints next to the injury that are not immobilized by the cast or splint. For example, if you have a long leg cast, exercise the hip joint and toes. If you have an arm cast or splint, exercise the shoulder, elbow, thumb, and fingers. °· Elevate your injured arm or leg on 1 or 2 pillows for the first 1 to 3 days to decrease  swelling and pain. It is best if you can comfortably elevate your cast so it is higher than your heart. °SEEK MEDICAL CARE IF:  °· Your cast or splint cracks. °· Your cast or splint is too tight or too loose. °· You have unbearable itching inside the cast. °· Your cast becomes wet or develops a soft spot or area. °· You have a bad smell coming from inside your cast. °· You get an object stuck under your cast. °· Your skin around the cast becomes red or raw. °· You have new pain or worsening pain after the cast has been applied. °SEEK IMMEDIATE MEDICAL CARE IF:  °· You have fluid leaking through the cast. °· You are unable to move your fingers or toes. °· You have discolored (blue or white), cool, painful, or very swollen fingers or toes beyond the cast. °· You have tingling or numbness around the injured area. °· You have severe pain or pressure under the cast. °· You have any difficulty with your breathing or have shortness of breath. °· You have chest pain. °  °This information is not intended to replace advice given to you by your health care provider. Make sure you discuss any questions you have with your health care   provider.   Document Released: 06/14/2000 Document Revised: 04/07/2013 Document Reviewed: 12/24/2012 Elsevier Interactive Patient Education 2016 [employer]  Forearm Fracture A forearm fracture is a break in one or both of the bones of your arm that are between the elbow and the wrist. Your forearm is made up of two bones called the radius and the ulna. Some forearm fractures will require surgery. HOME CARE If You Have a Cast:  Do not stick anything inside the cast to scratch your skin.  Check the skin around the cast every day. Tell your doctor about any concerns. You may put lotion on dry skin around the edges of the cast, but not on the skin underneath the cast. If You Have a Splint:  Wear it as told by your doctor. Remove it only as told by your doctor.  Loosen the splint if  your fingers become numb and tingle, or if they turn cold and blue. Bathing  Cover the cast or splint with a watertight plastic bag to protect it from water while you take a bath or a shower. Do not let the cast or splint get wet. Managing Pain, Stiffness, and Swelling  If told, apply ice to the injured area:  Put ice in a plastic bag.  Place a towel between your skin and the bag.  Leave the ice on for 20 minutes, 2-3 times a day.  Move your fingers often to avoid stiffness and to lessen swelling.  Raise the injured area above the level of your heart while you are sitting or lying down. Driving  Do not drive or use heavy machinery while taking pain medicine.  Do not drive while wearing a cast or splint on a hand that you use for driving. Activity  Return to your normal activities as told by your doctor. Ask your doctor what activities are safe for you.  Do range-of-motion exercises only as told by your doctor. Safety  Do not use your injured limb to support your body weight until your doctor says that you can. General Instructions  Do not put pressure on any part of the cast or splint until it is fully hardened. This may take several hours.  Keep the cast or splint clean and dry.  Do not use any tobacco products, including cigarettes, chewing tobacco, or electronic cigarettes. Tobacco can delay bone healing. If you need help quitting, ask your doctor.  Take medicines only as told by your doctor.  Keep all follow-up visits as told by your doctor. This is important. GET HELP IF:  Your pain medicine is not helping.  Your cast breaks or gets damaged.  Your cast gets loose.  Your cast feels too tight.  Your cast gets wet.  You have more severe pain or swelling than you did before the cast.  You have severe pain when you stretch your fingers.  You continue to have pain or stiffness in your elbow or your wrist after your cast is taken off. GET HELP RIGHT AWAY IF:     You cannot move your fingers.  You lose feeling in your fingers or your hand.  Your hand or your fingers turn cold and pale or blue.  You notice a bad smell coming from your cast.  You have fluid or drainage from underneath your cast.  You have new stains from blood, fluid, or drainage that is coming through your cast.   This information is not intended to replace advice given to you by your health care provider.  Make sure you discuss any questions you have with your health care provider.   Document Released: 12/04/2007 Document Revised: 07/08/2014 Document Reviewed: 01/31/2014 Elsevier Interactive Patient Education 2016 Elsevier Inc.  Wrist Fracture A wrist fracture is a break or crack in one of the bones of your wrist. Your wrist is made up of eight small bones at the palm of your hand (carpal bones) and two long bones that make up your forearm (radius and ulna). The goal of treatment is to hold the injured bone in place while it heals. Surgery may or may not be needed to care for your injured wrist.  HOME CARE  Keep your injured wrist raised (elevated). Move your fingers as much as you can.  Do not put pressure on any part of your cast or splint. It may break.  Use a plastic bag to protect your cast or splint from water while bathing or showering. Do not lower your cast or splint into water.  Take medicines only as told by your doctor.  Keep your cast or splint clean and dry. If it gets wet, damaged, or suddenly feels too tight, tell your doctor right away.  Do not use any tobacco products including cigarettes, chewing tobacco, or electronic cigarettes. Tobacco can slow bone healing. If you need help quitting, ask your doctor.  Keep all follow-up visits as told by your doctor. This is important.  Ask your doctor if you should take supplements of calcium and vitamins C and D. GET HELP IF:   Your cast or splint is damaged, breaks, or gets wet.  You have a fever.  You  have chills.  You have very bad pain that does not go away.  You have more swelling (inflammation) than before the cast was put on. GET HELP RIGHT AWAY IF:   Your hand or fingernails on the injured arm turn blue or gray, or feel cold or numb.  You lose some feeling in the fingers of your injured arm. MAKE SURE YOU:   Understand these instructions.  Will watch your condition.  Will get help right away if you are not doing well or get worse.   This information is not intended to replace advice given to you by your health care provider. Make sure you discuss any questions you have with your health care provider.   Document Released: 12/04/2007 Document Revised: 07/08/2014 Document Reviewed: 12/30/2011 Elsevier Interactive Patient Education [employer]

## 2015-07-10 NOTE — Progress Notes (Signed)
Orthopedic Tech Progress Note Patient Details:  Garrett Franco 06/13/1971 [id number]  Ortho Devices Type of Ortho Device: Arm sling, Sugartong splint Ortho Device/Splint Location: rue Ortho Device/Splint Interventions: Ordered, Application   Trinna PostMartinez, Keitra Carusone J 07/10/2015, 7:07 PM

## 2015-07-10 NOTE — ED Notes (Addendum)
Pt here for swelling and injury to right wrist after a fall yesterday. Pt able to move finger and sensation in tact. Pulse present. sts also he hit his head. sts made him light headed

## 2015-07-10 NOTE — ED Provider Notes (Signed)
CSN: [id number]     Arrival date & time 07/10/15  1304 History   First MD Initiated Contact with Patient 07/10/15 1702     Chief Complaint  Patient presents with  . Fall  . Wrist Injury     (Consider location/radiation/quality/duration/timing/severity/associated sxs/prior Treatment) Patient is a 45 y.o. male presenting with fall. The history is provided by the patient.  Fall This is a new problem. The current episode started yesterday. The problem occurs constantly. The problem has been unchanged. Associated symptoms include arthralgias, headaches, joint swelling and myalgias. Pertinent negatives include no abdominal pain, anorexia, change in bowel habit, chest pain, chills, congestion, coughing, diaphoresis, fatigue, fever, nausea, neck pain, numbness, rash, sore throat, swollen glands, urinary symptoms, vertigo, visual change, vomiting or weakness. The symptoms are aggravated by twisting. He has tried ice and rest for the symptoms. The treatment provided no relief.    Past Medical History  Diagnosis Date  . Hypertension    Past Surgical History  Procedure Laterality Date  . Thumb surgery      right   History reviewed. No pertinent family history. Social History  Substance Use Topics  . Smoking status: Current Every Day Smoker -- 0.50 packs/day  . Smokeless tobacco: None  . Alcohol Use: Yes     Comment: 280 ounces per week/  today states occasionally    Review of Systems  Constitutional: Negative for fever, chills, diaphoresis and fatigue.  HENT: Negative for congestion and sore throat.   Eyes: Negative for pain and visual disturbance.  Respiratory: Negative for cough and chest tightness.   Cardiovascular: Negative for chest pain.  Gastrointestinal: Negative for nausea, vomiting, abdominal pain, anorexia and change in bowel habit.  Genitourinary: Negative for dysuria.  Musculoskeletal: Positive for myalgias, joint swelling and arthralgias. Negative for back pain, gait  problem, neck pain and neck stiffness.  Skin: Negative for rash.  Neurological: Positive for headaches. Negative for dizziness, vertigo, facial asymmetry, weakness and numbness.  Psychiatric/Behavioral: Negative for confusion.      Allergies  Review of patient's allergies indicates no known allergies.  Home Medications   Prior to Admission medications   Medication Sig Start Date End Date Taking? Authorizing Provider  hydrochlorothiazide (HYDRODIURIL) 25 MG tablet Take 1 tablet (25 mg total) by mouth daily. Patient not taking: Reported on 03/04/2015 09/28/12   Nelva Nayobert Beaton, MD  HYDROcodone-acetaminophen (NORCO/VICODIN) 5-325 MG tablet Take 1 tablet by mouth every 4 (four) hours as needed. 07/10/15   Stacy GardnerAndrew Renold Kozar, MD  ondansetron (ZOFRAN) 4 MG tablet Take 1 tablet (4 mg total) by mouth every 6 (six) hours. 07/10/15   Stacy GardnerAndrew Fidencia Mccloud, MD  zolpidem (AMBIEN) 10 MG tablet Take 1/2-1 tablet at bedtime as needed for sleep Patient not taking: Reported on 03/04/2015 09/28/12   Nelva Nayobert Beaton, MD   BP 133/118 mmHg  Pulse 96  Temp(Src) 98 F (36.7 C)  Resp 18  SpO2 99% Physical Exam  Constitutional: He is oriented to person, place, and time. He appears well-developed and well-nourished. No distress.  HENT:  Head: Normocephalic and atraumatic.  Right Ear: External ear normal.  Left Ear: External ear normal.  Eyes: Conjunctivae and EOM are normal. Pupils are equal, round, and reactive to light.  Neck: Normal range of motion. Neck supple.  Cardiovascular: Normal rate, regular rhythm and normal heart sounds.  Exam reveals no gallop and no friction rub.   No murmur heard. Pulmonary/Chest: Effort normal and breath sounds normal. No respiratory distress. He has no wheezes. He has no  rales. He exhibits no tenderness.  Abdominal: Soft. Bowel sounds are normal. He exhibits no distension and no mass. There is no tenderness. There is no rebound and no guarding.  Musculoskeletal:       Right elbow: He  exhibits normal range of motion, no swelling, no effusion, no deformity and no laceration. No tenderness found.       Right wrist: He exhibits decreased range of motion, tenderness, bony tenderness, swelling and deformity.  Neurological: He is alert and oriented to person, place, and time. No cranial nerve deficit. Coordination normal.  Skin: Skin is warm and dry. No rash noted. He is not diaphoretic. No erythema.  Psychiatric: He has a normal mood and affect. His behavior is normal.    ED Course  Procedures (including critical care time) Labs Review Labs Reviewed - No data to display  Imaging Review Dg Forearm Right  07/10/2015  CLINICAL DATA:  Fall on ice with right wrist deformity. EXAM: RIGHT FOREARM - 2 VIEW COMPARISON:  07/10/2015 FINDINGS: Transverse fracture ulnar styloid. Distal radial metaphyseal fracture, primarily transverse, questionable intra-articular extension. No radio capitellar malalignment. Supinator fat pad unremarkable. No elbow joint effusion. No other fractures of the radius or ulna. IMPRESSION: 1. Transverse radial metaphyseal fracture, possible nondisplaced intra-articular extension. 2. Ulnar styloid fracture. Electronically Signed   By: Gaylyn Rong M.D.   On: 07/10/2015 18:22   Dg Wrist Complete Right  07/10/2015  CLINICAL DATA:  Right wrist pain and swelling post falling on ice yesterday EXAM: RIGHT WRIST - COMPLETE 3+ VIEW COMPARISON:  10/26/2009 FINDINGS: Four views of the right wrist submitted. There is old healed fracture deformity at the base of first metacarpal and at the base of proximal phalanx first finger. There is mild impacted fracture in distal right radial metaphysis best seen on lateral view. Question nondisplaced fracture at the tip of ulnar-styloid. IMPRESSION: Mild impacted fracture distal right radial metaphysis best seen on lateral view. Old fractures at the base of first metacarpal and base of proximal phalanx right thumb. Question nondisplaced  fracture at the tip of ulnar styloid. Electronically Signed   By: Natasha Mead M.D.   On: 07/10/2015 13:57   Ct Head Wo Contrast  07/10/2015  CLINICAL DATA:  Head injury after falling at home. No loss of consciousness. EXAM: CT HEAD WITHOUT CONTRAST TECHNIQUE: Contiguous axial images were obtained from the base of the skull through the vertex without intravenous contrast. COMPARISON:  None. FINDINGS: Bony calvarium appears intact. No mass effect or midline shift is noted. Ventricular size is within normal limits. There is no evidence of mass lesion, hemorrhage or acute infarction. IMPRESSION: Normal head CT. Electronically Signed   By: Lupita Raider, M.D.   On: 07/10/2015 17:53   Dg Hand Complete Right  07/10/2015  CLINICAL DATA:  Fall on ice. Right wrist deformity. Difficulty straightening fingers. EXAM: RIGHT HAND - COMPLETE 3+ VIEW COMPARISON:  Transverse metaphyseal fracture of the distal radius and transverse ulnar styloid fracture observed. Mild spurring of the distal pole scaphoid. Remote deformity of the proximal metaphysis of the first metatarsal and of the proximal phalanx of the thumb from old fractures. The fingers reflects during imaging which reduces sensitivity, but was the most that the patient could manage. The fingers are projecting on top of one another on the lateral projection, presumably the patient was unable to fan the fingers. Posterior to the proximal carpal row there is a well corticated density on the lateral projection which could be from an  old triquetrum fracture. This is not thought to be acute. FINDINGS: 1. I do not see an acute phalangeal fracture. Reduced sensitivity due to flexion and bony superimposition. 2. Deformities of the first metacarpal and proximal phalanx thumb due to remote old fracture. 3. Well corticated ossific structure posterior to the proximal carpal row on the lateral projection, possibly due to a remote triquetrum fracture. 4. Known distal radial and distal  ulnar fractures as described previously. IMPRESSION: Negative. Electronically Signed   By: Gaylyn Rong M.D.   On: 07/10/2015 18:25   I have personally reviewed and evaluated these images and lab results as part of my medical decision-making.   EKG Interpretation None      MDM   Final diagnoses:  Fall, initial encounter  Right radial fracture, closed, initial encounter  Fracture of right ulnar styloid, closed, initial encounter    45 year old black male with no significant past medical history presents after fall. Patient reports he slipped on ice yesterday landing on right outstretched hand. At that time he had significant wrist pain. Patient reports he tried over-the-counter medicines and ice without significant improvement in symptoms. Patient does report he struck head during event but denies LOC. He reports he still has a headache. Patient not on any blood thinners.  On arrival patient had swelling and obvious deformity to right wrist. Significant pain with attempted motion. Additionally patient had no trauma noted to head. In setting these findings x-ray of right upper extremity and CT head obtained. No abnormality on CT head noted. Patient found to have right radial nondisplaced fracture. Additionally patient found to have a right ulnar styloid fracture. Patient placed in sugar tong splint and will follow up with [facility]. Patient stable at time of discharge and given short course of pain medication. Patient given strict return precautions and in agreement with plan.  Attending has seen and evaluated patient and Dr. Dalene Seltzer is in agreement with plan.    Stacy Gardner, MD 07/10/15 1941  Alvira Monday, MD 07/12/15 [phone]

## 2015-12-26 ENCOUNTER — Encounter (HOSPITAL_COMMUNITY): Payer: Self-pay | Admitting: Nurse Practitioner

## 2015-12-26 ENCOUNTER — Emergency Department (HOSPITAL_COMMUNITY)
Admission: EM | Admit: 2015-12-26 | Discharge: 2015-12-26 | Disposition: A | Discharge status: Home / Self Care | Payer: 59 | Attending: Emergency Medicine | Admitting: Emergency Medicine

## 2015-12-26 ENCOUNTER — Emergency Department (HOSPITAL_COMMUNITY): Payer: 59

## 2015-12-26 DIAGNOSIS — I10 Essential (primary) hypertension: Secondary | ICD-10-CM | POA: Diagnosis not present

## 2015-12-26 DIAGNOSIS — F17210 Nicotine dependence, cigarettes, uncomplicated: Secondary | ICD-10-CM | POA: Insufficient documentation

## 2015-12-26 DIAGNOSIS — R1030 Lower abdominal pain, unspecified: Secondary | ICD-10-CM

## 2015-12-26 DIAGNOSIS — Z79899 Other long term (current) drug therapy: Secondary | ICD-10-CM | POA: Diagnosis not present

## 2015-12-26 LAB — COMPREHENSIVE METABOLIC PANEL
ALBUMIN: 3.8 g/dL (ref 3.5–5.0)
ALT: 22 U/L (ref 17–63)
ANION GAP: 10 (ref 5–15)
AST: 32 U/L (ref 15–41)
Alkaline Phosphatase: 57 U/L (ref 38–126)
BILIRUBIN TOTAL: 0.3 mg/dL (ref 0.3–1.2)
CO2: 28 mmol/L (ref 22–32)
CREATININE: 0.81 mg/dL (ref 0.61–1.24)
Calcium: 9.5 mg/dL (ref 8.9–10.3)
Chloride: 100 mmol/L — ABNORMAL LOW (ref 101–111)
Glucose, Bld: 102 mg/dL — ABNORMAL HIGH (ref 65–99)
POTASSIUM: 3.8 mmol/L (ref 3.5–5.1)
Sodium: 138 mmol/L (ref 135–145)
Total Protein: 8 g/dL (ref 6.5–8.1)

## 2015-12-26 LAB — CBC
HCT: 44.1 % (ref 39.0–52.0)
Hemoglobin: 14.4 g/dL (ref 13.0–17.0)
MCH: 29.9 pg (ref 26.0–34.0)
MCHC: 32.7 g/dL (ref 30.0–36.0)
MCV: 91.7 fL (ref 78.0–100.0)
PLATELETS: 185 10*3/uL (ref 150–400)
RBC: 4.81 MIL/uL (ref 4.22–5.81)
RDW: 14.2 % (ref 11.5–15.5)
WBC: 8.9 10*3/uL (ref 4.0–10.5)

## 2015-12-26 LAB — URINALYSIS, ROUTINE W REFLEX MICROSCOPIC
BILIRUBIN URINE: NEGATIVE
GLUCOSE, UA: NEGATIVE mg/dL
HGB URINE DIPSTICK: NEGATIVE
Ketones, ur: NEGATIVE mg/dL
Leukocytes, UA: NEGATIVE
Nitrite: NEGATIVE
PROTEIN: NEGATIVE mg/dL
Specific Gravity, Urine: 1.006 (ref 1.005–1.030)
pH: 6.5 (ref 5.0–8.0)

## 2015-12-26 LAB — I-STAT CG4 LACTIC ACID, ED: Lactic Acid, Venous: 2.81 mmol/L (ref 0.5–1.9)

## 2015-12-26 MED ORDER — FENTANYL CITRATE (PF) 100 MCG/2ML IJ SOLN: 50.0000 ug | INTRAMUSCULAR | Status: DC | PRN

## 2015-12-26 MED ORDER — ONDANSETRON HCL 4 MG/2ML IJ SOLN: 4.0000 mg | Freq: Once | INTRAMUSCULAR | Status: DC

## 2015-12-26 MED ORDER — ONDANSETRON 4 MG PO TBDP: ORAL_TABLET | ORAL | Status: DC

## 2015-12-26 MED ORDER — SODIUM CHLORIDE 0.9 % IV BOLUS (SEPSIS)
1000.0000 mL | Freq: Once | INTRAVENOUS | Status: AC
  Administered 2015-12-26: 1000 mL via INTRAVENOUS

## 2015-12-26 MED ORDER — IOPAMIDOL (ISOVUE-300) INJECTION 61%
INTRAVENOUS | Status: AC
  Administered 2015-12-26: 100 mL
  Filled 2015-12-26: qty 100

## 2015-12-26 NOTE — Discharge Instructions (Signed)
If you were given medicines take as directed.  If you are on coumadin or contraceptives realize their levels and effectiveness is altered by many different medicines.  If you have any reaction (rash, tongues swelling, other) to the medicines stop taking and see a physician.    If your blood pressure was elevated in the ER make sure you follow up for management with a primary doctor or return for chest pain, shortness of breath or stroke symptoms.  Please follow up as directed and return to the ER or see a physician for new or worsening symptoms.  Thank you. Filed Vitals:   12/26/15 1545 12/26/15 1641 12/26/15 1802  BP: 125/91 125/83 126/97  Pulse: 113 102 108  Temp: 98.5 F (36.9 C)    TempSrc: Oral    Resp: 16  16  SpO2: 100% 100% 100%

## 2015-12-26 NOTE — ED Provider Notes (Signed)
CSN: [id number]     Arrival date & time 12/26/15  1531 History   First MD Initiated Contact with Patient 12/26/15 1630     Chief Complaint  Patient presents with  . GI Problem     (Consider location/radiation/quality/duration/timing/severity/associated sxs/prior Treatment) HPI Comments: 45 year old male with smoking history, high blood pressure presents with 2 weeks of worsening lower abdominal pain. Patient is also cough congestion and aches. Patient has been lifting heavy cinder blocks at work and feels the pain lowers worse with lifting and has persistent constant. Patient is now vomiting with it. No known history of hernias. No urinary symptoms. No fevers or chills no abdominal surgery history  Patient is a 45 y.o. male presenting with GI illness. The history is provided by the patient.  GI Problem Associated symptoms include abdominal pain. Pertinent negatives include no chest pain, no headaches and no shortness of breath.    Past Medical History  Diagnosis Date  . Hypertension    Past Surgical History  Procedure Laterality Date  . Thumb surgery      right   History reviewed. No pertinent family history. Social History  Substance Use Topics  . Smoking status: Current Every Day Smoker -- 0.50 packs/day    Types: Cigarettes  . Smokeless tobacco: None  . Alcohol Use: Yes     Comment: 280 ounces per week/  today states occasionally    Review of Systems  Constitutional: Positive for appetite change. Negative for fever and chills.  HENT: Negative for congestion.   Eyes: Negative for visual disturbance.  Respiratory: Negative for shortness of breath.   Cardiovascular: Negative for chest pain.  Gastrointestinal: Positive for nausea, vomiting and abdominal pain.  Genitourinary: Negative for dysuria and flank pain.  Musculoskeletal: Positive for arthralgias. Negative for back pain, neck pain and neck stiffness.  Skin: Negative for rash.  Neurological: Negative for  light-headedness and headaches.      Allergies  Review of patient's allergies indicates no known allergies.  Home Medications   Prior to Admission medications   Medication Sig Start Date End Date Taking? Authorizing Provider  dextromethorphan-guaiFENesin (MUCINEX DM) 30-600 MG 12hr tablet Take 1 tablet by mouth 2 (two) times daily.   Yes Historical Provider, MD  hydrochlorothiazide (HYDRODIURIL) 25 MG tablet Take 1 tablet (25 mg total) by mouth daily. Patient not taking: Reported on 03/04/2015 09/28/12   Nelva Nay, MD  HYDROcodone-acetaminophen (NORCO/VICODIN) 5-325 MG tablet Take 1 tablet by mouth every 4 (four) hours as needed. 07/10/15   Stacy Gardner, MD  ondansetron (ZOFRAN ODT) 4 MG disintegrating tablet  ODT q4 hours prn nausea/vomit 12/26/15   Blane Ohara, MD  ondansetron (ZOFRAN) 4 MG tablet Take 1 tablet (4 mg total) by mouth every 6 (six) hours. 07/10/15   Stacy Gardner, MD  zolpidem (AMBIEN) 10 MG tablet Take 1/2-1 tablet at bedtime as needed for sleep Patient not taking: Reported on 03/04/2015 09/28/12   Nelva Nay, MD   BP 126/97 mmHg  Pulse 108  Temp(Src) 98.5 F (36.9 C) (Oral)  Resp 16  SpO2 100% Physical Exam  Constitutional: He is oriented to person, place, and time. He appears well-developed and well-nourished.  HENT:  Head: Normocephalic and atraumatic.  Mild dry mucous membranes  Eyes: Conjunctivae are normal. Right eye exhibits no discharge. Left eye exhibits no discharge.  Neck: Normal range of motion. Neck supple. No tracheal deviation present.  Cardiovascular: Regular rhythm.   Pulmonary/Chest: Effort normal and breath sounds normal.  Abdominal: Soft. He exhibits  no distension. There is no tenderness. There is no guarding.  Genitourinary:  No hernia appreciated bilateral, testicles normal position nontender no swelling no infection externally.  Musculoskeletal: He exhibits no edema.  Neurological: He is alert and oriented to person, place, and  time.  Skin: Skin is warm. No rash noted.  Psychiatric: He has a normal mood and affect.  Nursing note and vitals reviewed.   ED Course  Procedures (including critical care time) Labs Review Labs Reviewed  COMPREHENSIVE METABOLIC PANEL - Abnormal; Notable for the following:    Chloride 100 (*)    Glucose, Bld 102 (*)    BUN <5 (*)    All other components within normal limits  I-STAT CG4 LACTIC ACID, ED - Abnormal; Notable for the following:    Lactic Acid, Venous 2.81 (*)    All other components within normal limits  CBC  URINALYSIS, ROUTINE W REFLEX MICROSCOPIC (NOT AT [facility])  I-STAT CG4 LACTIC ACID, ED    Imaging Review Ct Abdomen Pelvis W Contrast  12/26/2015  CLINICAL DATA:  45 year old with 2 week history of lower abdominal pain, nausea, vomiting, diarrhea, chills and body aches. Patient does heavy lifting at work, and is being evaluated for a possible hernia. EXAM: CT ABDOMEN AND PELVIS WITH CONTRAST TECHNIQUE: Multidetector CT imaging of the abdomen and pelvis was performed using the standard protocol following bolus administration of intravenous contrast. CONTRAST:  100mL ISOVUE-300 IOPAMIDOL (ISOVUE-300) INJECTION 61% IV. Oral contrast was not administered. COMPARISON:  None. FINDINGS: Lower chest:  Heart size normal. Visualized lung bases clear. Hepatobiliary: Liver normal in size and appearance. Gallbladder normal in appearance without calcified gallstones. No biliary ductal dilation. Pancreas: Normal in appearance without evidence of mass, ductal dilation, or inflammation. Spleen: Normal in size and appearance. Adrenals/Urinary Tract: Normal appearing adrenal glands. Kidneys normal in size and appearance without focal parenchymal abnormality. No evidence of urinary tract calculi or obstruction. Normal-appearing urinary bladder. Stomach/Bowel: Stomach normal in appearance for the degree of distention. Normal-appearing small bowel. Normal-appearing colon with expected stool burden.  Appendix not clearly visualized, but no pericecal inflammation. Vascular/Lymphatic: Mild aortoiliac atherosclerosis without aneurysm. No pathologic lymphadenopathy. Reproductive: Prostate gland and seminal vesicles normal in size and appearance for age. Other: No evidence of inguinal hernia on either side. No evidence of abdominal wall hernia. Nonspecific presacral edema. Musculoskeletal: Regional skeleton intact without acute or significant osseous abnormality. IMPRESSION: 1. Nonspecific presacral edema. This may just be dependent edema if the patient has been lying supine for a significant length of time recently. 2. 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 3. Otherwise normal examination. Electronically Signed   By: Hulan Saashomas  Lawrence M.D.   On: 12/26/2015 18:21   I have personally reviewed and evaluated these images and lab results as part of my medical decision-making.   EKG Interpretation None      MDM   Final diagnoses:  Lower abdominal pain   Patient presents with worsening lower abdominal pain and vomiting. Patient has very mild tenderness lower abdomen. Discussed low likelihood of significant pathology discussed risks of radiation. With persistent vomiting and no other cause plan for CT scan to look for signs of hernia or other causes vomiting. CT scan results reviewed no acute findings, vascular disease advanced for age we'll discuss this with the patient and advise smoking cessation.  Patient be given Zofran with outpatient follow-up with primary doctor.  Results and differential diagnosis were discussed with the patient/parent/guardian. Xrays were independently reviewed by myself.  Close follow  up outpatient was discussed, comfortable with the plan.   Medications  ondansetron (ZOFRAN) injection 4 mg (0 mg Intravenous Hold 12/26/15 1703)  fentaNYL (SUBLIMAZE) injection 50 mcg (not administered)  sodium chloride 0.9 % bolus 1,000 mL (1,000 mLs Intravenous New  Bag/Given 12/26/15 1702)  iopamidol (ISOVUE-300) 61 % injection (100 mLs  Contrast Given 12/26/15 1748)    Filed Vitals:   12/26/15 1545 12/26/15 1641 12/26/15 1802  BP: 125/91 125/83 126/97  Pulse: 113 102 108  Temp: 98.5 F (36.9 C)    TempSrc: Oral    Resp: 16  16  SpO2: 100% 100% 100%    Final diagnoses:  Lower abdominal pain       Blane OharaJoshua Hali Balgobin, MD 12/26/15 1830

## 2015-12-26 NOTE — ED Notes (Signed)
Reported Lactic Acid 2.81 to Dr. Lynelle DoctorKnapp.

## 2015-12-26 NOTE — ED Notes (Signed)
He c/o 2 week history of cough, congestion, n/v/d, chills, body aches. He tried mucinex with no relief. He also c/o lower abd pain, which he feels may be from straining to lift heavy cinder blocks at work. He is alert and breathing easily

## 2016-06-01 ENCOUNTER — Emergency Department (HOSPITAL_COMMUNITY): Payer: 59

## 2016-06-01 ENCOUNTER — Encounter (HOSPITAL_COMMUNITY): Payer: Self-pay | Admitting: Emergency Medicine

## 2016-06-01 ENCOUNTER — Emergency Department (HOSPITAL_COMMUNITY)
Admission: EM | Admit: 2016-06-01 | Discharge: 2016-06-01 | Disposition: A | Discharge status: Home / Self Care | Payer: 59 | Attending: Emergency Medicine | Admitting: Emergency Medicine

## 2016-06-01 DIAGNOSIS — Y929 Unspecified place or not applicable: Secondary | ICD-10-CM | POA: Insufficient documentation

## 2016-06-01 DIAGNOSIS — Y999 Unspecified external cause status: Secondary | ICD-10-CM | POA: Diagnosis not present

## 2016-06-01 DIAGNOSIS — Z79899 Other long term (current) drug therapy: Secondary | ICD-10-CM | POA: Insufficient documentation

## 2016-06-01 DIAGNOSIS — S0181XA Laceration without foreign body of other part of head, initial encounter: Secondary | ICD-10-CM | POA: Diagnosis not present

## 2016-06-01 DIAGNOSIS — S022XXA Fracture of nasal bones, initial encounter for closed fracture: Secondary | ICD-10-CM | POA: Diagnosis not present

## 2016-06-01 DIAGNOSIS — Z23 Encounter for immunization: Secondary | ICD-10-CM | POA: Insufficient documentation

## 2016-06-01 DIAGNOSIS — F17210 Nicotine dependence, cigarettes, uncomplicated: Secondary | ICD-10-CM | POA: Insufficient documentation

## 2016-06-01 DIAGNOSIS — S0083XA Contusion of other part of head, initial encounter: Secondary | ICD-10-CM

## 2016-06-01 DIAGNOSIS — Y939 Activity, unspecified: Secondary | ICD-10-CM | POA: Diagnosis not present

## 2016-06-01 DIAGNOSIS — S0992XA Unspecified injury of nose, initial encounter: Secondary | ICD-10-CM | POA: Diagnosis present

## 2016-06-01 DIAGNOSIS — I10 Essential (primary) hypertension: Secondary | ICD-10-CM | POA: Diagnosis not present

## 2016-06-01 MED ORDER — BACITRACIN ZINC 500 UNIT/GM EX OINT
TOPICAL_OINTMENT | CUTANEOUS | Status: AC
  Administered 2016-06-01: 1
  Filled 2016-06-01: qty 0.9

## 2016-06-01 MED ORDER — TETANUS-DIPHTH-ACELL PERTUSSIS 5-2.5-18.5 LF-MCG/0.5 IM SUSP
0.5000 mL | Freq: Once | INTRAMUSCULAR | Status: AC
  Administered 2016-06-01: 0.5 mL via INTRAMUSCULAR
  Filled 2016-06-01: qty 0.5

## 2016-06-01 MED ORDER — ACETAMINOPHEN 500 MG PO TABS: 500.0000 mg | ORAL_TABLET | Freq: Four times a day (QID) | ORAL | 0 refills | Status: DC | PRN

## 2016-06-01 MED ORDER — IBUPROFEN 400 MG PO TABS: 400.0000 mg | ORAL_TABLET | Freq: Four times a day (QID) | ORAL | 0 refills | Status: AC | PRN

## 2016-06-01 MED ORDER — LIDOCAINE HCL 1 % IJ SOLN
INTRAMUSCULAR | Status: AC
  Administered 2016-06-01: 20 mL
  Filled 2016-06-01: qty 20

## 2016-06-01 NOTE — ED Notes (Signed)
Pt asleep in room.
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
## 2016-06-01 NOTE — ED Notes (Signed)
Patient was given sprite drink and had no difficulty drinking or swallowing.
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
## 2016-06-01 NOTE — ED Notes (Signed)
Patient face was washed and wounds debride with ns saline.

## 2016-06-01 NOTE — ED Triage Notes (Signed)
Pt adds he feels dehydrated as he has not been eating well and has been drinking heavily for the past few days

## 2016-06-26 ENCOUNTER — Encounter (HOSPITAL_COMMUNITY): Payer: Self-pay | Admitting: Emergency Medicine

## 2016-06-26 ENCOUNTER — Ambulatory Visit (HOSPITAL_COMMUNITY)
Admission: EM | Admit: 2016-06-26 | Discharge: 2016-06-26 | Disposition: A | Discharge status: Home / Self Care | Payer: 59 | Attending: Family Medicine | Admitting: Family Medicine

## 2016-06-26 DIAGNOSIS — Z4802 Encounter for removal of sutures: Secondary | ICD-10-CM | POA: Diagnosis not present
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
## 2016-06-26 NOTE — ED Triage Notes (Signed)
[facility] a [facility] needing sutures removed from the left side of his face that were placed Dec 1.

## 2016-06-26 NOTE — Discharge Instructions (Signed)
Return as needed

## 2016-11-30 ENCOUNTER — Emergency Department (HOSPITAL_COMMUNITY): Payer: 59

## 2016-11-30 ENCOUNTER — Emergency Department (HOSPITAL_COMMUNITY)
Admission: EM | Admit: 2016-11-30 | Discharge: 2016-11-30 | Disposition: A | Discharge status: Home / Self Care | Payer: 59 | Attending: Emergency Medicine | Admitting: Emergency Medicine

## 2016-11-30 DIAGNOSIS — S0101XA Laceration without foreign body of scalp, initial encounter: Secondary | ICD-10-CM | POA: Insufficient documentation

## 2016-11-30 DIAGNOSIS — W19XXXA Unspecified fall, initial encounter: Secondary | ICD-10-CM | POA: Insufficient documentation

## 2016-11-30 DIAGNOSIS — Y929 Unspecified place or not applicable: Secondary | ICD-10-CM | POA: Insufficient documentation

## 2016-11-30 DIAGNOSIS — Y999 Unspecified external cause status: Secondary | ICD-10-CM | POA: Insufficient documentation

## 2016-11-30 DIAGNOSIS — I10 Essential (primary) hypertension: Secondary | ICD-10-CM | POA: Insufficient documentation

## 2016-11-30 DIAGNOSIS — Y939 Activity, unspecified: Secondary | ICD-10-CM | POA: Insufficient documentation

## 2016-11-30 MED ORDER — HYDROCODONE-ACETAMINOPHEN 5-325 MG PO TABS
1.0000 | ORAL_TABLET | Freq: Once | ORAL | Status: AC
  Administered 2016-11-30: 1 via ORAL
  Filled 2016-11-30: qty 1

## 2016-11-30 MED ORDER — LIDOCAINE-EPINEPHRINE (PF) 2 %-1:200000 IJ SOLN
10.0000 mL | Freq: Once | INTRAMUSCULAR | Status: AC
  Administered 2016-11-30: 10 mL via INTRADERMAL
  Filled 2016-11-30: qty 20

## 2016-11-30 MED ORDER — SODIUM CHLORIDE 0.9 % IV BOLUS (SEPSIS)
1000.0000 mL | Freq: Once | INTRAVENOUS | Status: AC
  Administered 2016-11-30: 1000 mL via INTRAVENOUS

## 2016-11-30 NOTE — ED Provider Notes (Signed)
[facility]   CSN: [id number] Arrival date & time: 11/30/16  1936     History   Chief Complaint Chief Complaint  Patient presents with  . Fall  . Laceration    HPI Garrett Franco is a 46 y.o. male.  The history is provided by the patient, a relative, a caregiver and medical records. No language interpreter was used.  Laceration   The incident occurred less than 1 hour ago. The laceration is located on the scalp. The laceration is 5 cm in size. Injury mechanism: concrete porch. The pain is moderate. The pain has been constant since onset. He reports no foreign bodies present. His tetanus status is UTD.    Past Medical History:  Diagnosis Date  . Hypertension     There are no active problems to display for this patient.   Past Surgical History:  Procedure Laterality Date  . thumb surgery     right       Home Medications    Prior to Admission medications   Medication Sig Start Date End Date Taking? Authorizing Provider  ibuprofen (ADVIL,MOTRIN) 400 MG tablet Take 1 tablet (400 mg total) by mouth every 6 (six) hours as needed. 06/01/16   de Villier, Daryl F II, PA    Family History Family History  Problem Relation Age of Onset  . Hypertension Other   . Diabetes Other     Social History Social History  Substance Use Topics  . Smoking status: Current Every Day Smoker    Packs/day: 0.50    Types: Cigarettes  . Smokeless tobacco: Never Used  . Alcohol use Yes     Comment: daily      Allergies   Patient has no known allergies.   Review of Systems Review of Systems  Constitutional: Negative for chills and fever.  HENT: Negative for ear pain and sore throat.   Eyes: Negative for pain and visual disturbance.  Respiratory: Negative for cough and shortness of breath.   Cardiovascular: Negative for chest pain and palpitations.  Gastrointestinal: Negative for abdominal pain and vomiting.  Genitourinary: Negative for dysuria and  hematuria.  Musculoskeletal: Negative for arthralgias and back pain.  Skin: Positive for wound. Negative for color change and rash.  Neurological: Negative for seizures and syncope.  All other systems reviewed and are negative.    Physical Exam Updated Vital Signs BP (!) 116/95   Pulse (!) 109   Temp 98.3 F (36.8 C)   Resp 18   SpO2 100%   Physical Exam  Constitutional: He appears well-developed.  Smells of EtOH, appears intoxicated  HENT:  Head: Normocephalic.  Large multiple posterior scalp lacerations with active oozing. Local swelling and likely underlying hematoma. No palpable skull deformity  Eyes: Conjunctivae are normal.  Neck: Neck supple.  Cardiovascular: Normal rate and regular rhythm.   No murmur heard. Pulmonary/Chest: Effort normal and breath sounds normal. No respiratory distress.  Abdominal: Soft. There is no tenderness.  Musculoskeletal: He exhibits no edema.  Neurological: He is alert. No cranial nerve deficit. Coordination normal.  Skin: Skin is warm and dry. Laceration (scalp) noted.  Nursing note and vitals reviewed.    ED Treatments / Results  Labs (all labs ordered are listed, but only abnormal results are displayed) Labs Reviewed - No data to display  EKG  EKG Interpretation None       Radiology Ct Head Wo Contrast  Result Date: 11/30/2016 CLINICAL DATA:  Recent fall while intoxicated with parietal laceration  EXAM: CT HEAD WITHOUT CONTRAST CT CERVICAL SPINE WITHOUT CONTRAST TECHNIQUE: Multidetector CT imaging of the head and cervical spine was performed following the standard protocol without intravenous contrast. Multiplanar CT image reconstructions of the cervical spine were also generated. COMPARISON:  06/01/2016 FINDINGS: CT HEAD FINDINGS Brain: No evidence of acute infarction, hemorrhage, hydrocephalus, extra-axial collection or mass lesion/mass effect. Vascular: No hyperdense vessel or unexpected calcification. Skull: Prior nasal bone  fractures are seen with mild deviation to the right stable from the prior exam. No acute fractures noted. Sinuses/Orbits: No acute finding. Other: Right posterior parietal hematoma is noted. Surgical staples are seen consistent with the recent laceration. CT CERVICAL SPINE FINDINGS Alignment: Normal. Skull base and vertebrae: No acute fracture. No primary bone lesion or focal pathologic process. Soft tissues and spinal canal: No prevertebral fluid or swelling. No visible canal hematoma. Disc levels:  No acute disc pathology is noted. Upper chest: Within normal limits. IMPRESSION: CT of the head:  Right posterior parietal scalp hematoma. Old nasal bone fractures. No acute intracranial abnormality is noted. CT of cervical spine:  No acute abnormality noted. Electronically Signed   By: Alcide Clever M.D.   On: 11/30/2016 21:33   Ct Cervical Spine Wo Contrast  Result Date: 11/30/2016 CLINICAL DATA:  Recent fall while intoxicated with parietal laceration EXAM: CT HEAD WITHOUT CONTRAST CT CERVICAL SPINE WITHOUT CONTRAST TECHNIQUE: Multidetector CT imaging of the head and cervical spine was performed following the standard protocol without intravenous contrast. Multiplanar CT image reconstructions of the cervical spine were also generated. COMPARISON:  06/01/2016 FINDINGS: CT HEAD FINDINGS Brain: No evidence of acute infarction, hemorrhage, hydrocephalus, extra-axial collection or mass lesion/mass effect. Vascular: No hyperdense vessel or unexpected calcification. Skull: Prior nasal bone fractures are seen with mild deviation to the right stable from the prior exam. No acute fractures noted. Sinuses/Orbits: No acute finding. Other: Right posterior parietal hematoma is noted. Surgical staples are seen consistent with the recent laceration. CT CERVICAL SPINE FINDINGS Alignment: Normal. Skull base and vertebrae: No acute fracture. No primary bone lesion or focal pathologic process. Soft tissues and spinal canal: No  prevertebral fluid or swelling. No visible canal hematoma. Disc levels:  No acute disc pathology is noted. Upper chest: Within normal limits. IMPRESSION: CT of the head:  Right posterior parietal scalp hematoma. Old nasal bone fractures. No acute intracranial abnormality is noted. CT of cervical spine:  No acute abnormality noted. Electronically Signed   By: Alcide Clever M.D.   On: 11/30/2016 21:33    Procedures .Marland KitchenLaceration Repair Date/Time: 11/30/2016 8:51 PM Performed by: Hebert Soho Authorized by: Hebert Soho   Consent:    Consent obtained:  Verbal   Consent given by:  Patient   Risks discussed:  Infection, pain and poor cosmetic result   Alternatives discussed:  No treatment Anesthesia (see MAR for exact dosages):    Anesthesia method:  Local infiltration   Local anesthetic:  Lidocaine 1% WITH epi Laceration details:    Location:  Scalp   Scalp location:  Occipital   Length (cm):  6 Repair type:    Repair type:  Simple Skin repair:    Repair method:  Staples   Number of staples:  14 Approximation:    Approximation:  Close   Vermilion border: well-aligned   Post-procedure details:    Dressing:  Bulky dressing   Patient tolerance of procedure:  Tolerated well, no immediate complications    (including critical care time)  Medications Ordered in ED Medications  lidocaine-EPINEPHrine (  XYLOCAINE W/EPI) 2 %-1:200000 (PF) injection 10 mL (10 mLs Intradermal Given 11/30/16 2041)  sodium chloride 0.9 % bolus 1,000 mL (0 mLs Intravenous Stopped 11/30/16 2243)  HYDROcodone-acetaminophen (NORCO/VICODIN) 5-325 MG per tablet 1 tablet (1 tablet Oral Given 11/30/16 2150)     Initial Impression / Assessment and Plan / ED Course  I have reviewed the triage vital signs and the nursing notes.  Pertinent labs & imaging results that were available during my care of the patient were reviewed by me and considered in my medical decision making (see chart for details).     46 year old male history  of substance abuse and hypertension who presents with scalp laceration after fall onto concrete porch.  Fall was witnessed by cousin, who accompanies patient today.  Cousin states patient was walking when he lost balance and started falling forward.  At last second, patient turned and hit back of head onto concrete porch.  Cousin endorses patient had complete LOC for several minutes. Pt does not remember exact incident.  Patient with reported alcohol, marijuana and cocaine use.  Last tetanus shot 05/2016.  Afebrile, tachycardic 109, VSS.  Brisk oozing from posterior occipital scalp laceration despite previous pressure dressing.  Laceration closed with staples causing hemostasis.  Patient with no focal neuro deficits or palpable skull deformity.  Exam otherwise unremarkable.  CT head showing right posterior parietal scalp hematoma without intra-cranial abnormality.  CT C-spine without acute abnormality.  Cousin and patient's daughter comfortable with continued to monitor patient at home.  Family instructed on concussion precautions.  Patient stable time of discharge.   Final Clinical Impressions(s) / ED Diagnoses   Final diagnoses:  Occipital scalp laceration, initial encounter  Fall, initial encounter    New Prescriptions Discharge Medication List as of 11/30/2016 10:55 PM       Eleftherios Dudenhoeffer, Homero FellersFrank, MD 12/01/16 [id number]    Alvira MondaySchlossman, Erin, MD 12/02/16 [id number]

## 2016-11-30 NOTE — ED Triage Notes (Addendum)
Pt BIB EMS for witnessed fall/head lac. Per EMS pt fell straight back and hit posterior head, LOC for 1-2 min. Pt endorses drinking two 40 oz beer and cocaine use. Pt not on blood thinners, bleeding controlled. Pt A & Ox4; resp e/u; nad noted at this time.

## 2016-11-30 NOTE — ED Notes (Signed)
EDP at bedside stapling lac.

## 2016-11-30 NOTE — ED Notes (Signed)
Bacitracin applied  Wound; pt refuses to let RN dress wound. Supplies sent home with pt sister.

## 2016-11-30 NOTE — ED Notes (Signed)
Pt head bleeding through gauze/wrap; EDP made aware and at bedside.

## 2016-12-16 ENCOUNTER — Encounter (HOSPITAL_COMMUNITY): Payer: Self-pay | Admitting: Family Medicine

## 2016-12-16 ENCOUNTER — Ambulatory Visit (HOSPITAL_COMMUNITY)
Admission: EM | Admit: 2016-12-16 | Discharge: 2016-12-16 | Disposition: A | Discharge status: Home / Self Care | Payer: 59 | Attending: Family Medicine | Admitting: Family Medicine

## 2016-12-16 DIAGNOSIS — Z4802 Encounter for removal of sutures: Secondary | ICD-10-CM

## 2016-12-16 DIAGNOSIS — I10 Essential (primary) hypertension: Secondary | ICD-10-CM

## 2016-12-16 MED ORDER — AMLODIPINE BESYLATE 5 MG PO TABS: 5.0000 mg | ORAL_TABLET | Freq: Every day | ORAL | 3 refills | Status: AC

## 2016-12-16 NOTE — ED Triage Notes (Signed)
Pt  Reports  He  Is  Here  For  Staple removal  Staples  In  Place  For  Several   Weeks

## 2016-12-16 NOTE — ED Notes (Signed)
bp  Is   Elevated   As  Well

## 2016-12-16 NOTE — Discharge Instructions (Signed)
Please follow-up with Dr. Neva SeatGreene as noted below to have your blood pressure checked in 6 weeks.
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
## 2017-01-28 ENCOUNTER — Emergency Department (HOSPITAL_COMMUNITY)
Admission: EM | Admit: 2017-01-28 | Discharge: 2017-01-28 | Discharge status: Against Medical Advice | Payer: No Typology Code available for payment source | Attending: Emergency Medicine | Admitting: Emergency Medicine

## 2017-01-28 DIAGNOSIS — Z5321 Procedure and treatment not carried out due to patient leaving prior to being seen by health care provider: Secondary | ICD-10-CM | POA: Diagnosis not present

## 2017-01-28 NOTE — ED Notes (Signed)
Unable to locate in waiting room. LWBS before triage

## 2017-01-28 NOTE — ED Notes (Signed)
Unable to locate pt. several times by staff at waiting area.  

## 2017-01-28 NOTE — ED Triage Notes (Signed)
No answer x 1 for triage

## 2017-06-29 IMAGING — CT CT HEAD W/O CM
5 of 8 series · 16 of 47 positions shown, 17 images · non-contrast
Comparison: 06/01/2016

CLINICAL DATA: Recent fall while intoxicated with parietal
laceration

EXAM:
CT HEAD WITHOUT CONTRAST
CT CERVICAL SPINE WITHOUT CONTRAST
TECHNIQUE: Multidetector CT imaging of the head and cervical spine was
performed following the standard protocol without intravenous
contrast. Multiplanar CT image reconstructions of the cervical spine
were also generated.

[Series 3: head without · axial · non-contrast · 0.47mm/px · z∈[-193,-38]mm · 3 of 32 slices shown, 4 images]
[im 1/32  brain]
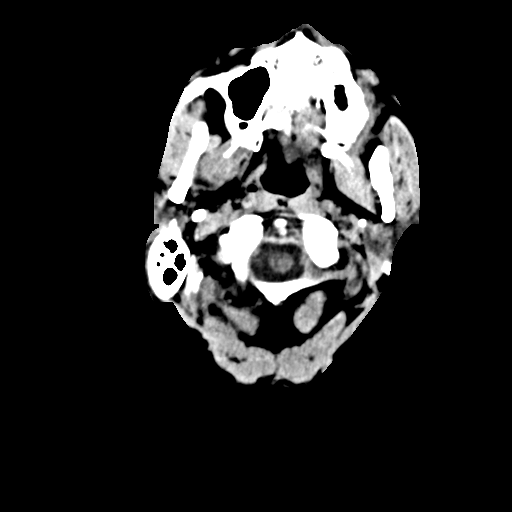
[im 1/32  bone]
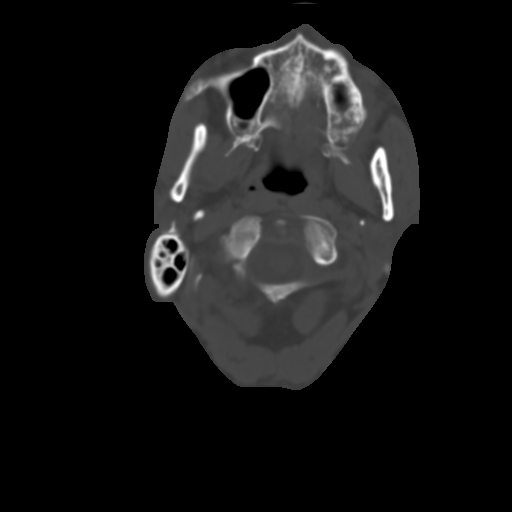
[im 16/32  brain]
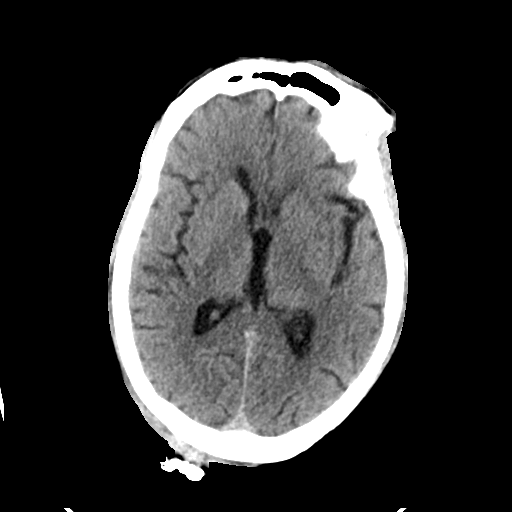
[im 32/32  brain]
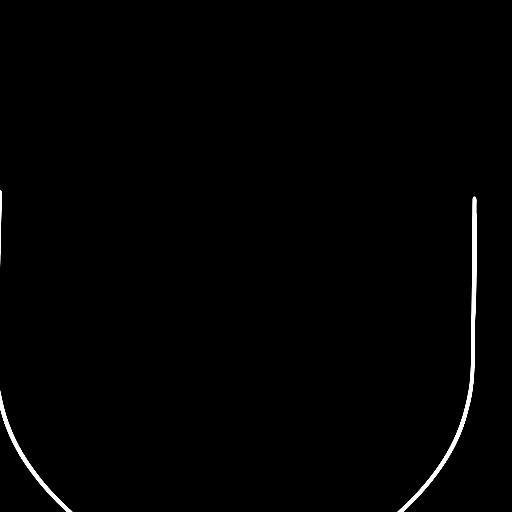

[Series 4: head bone · axial · 0.47mm/px · z∈[-171,-61]mm · 6 of 78 slices shown]
[im 12/78  bone]
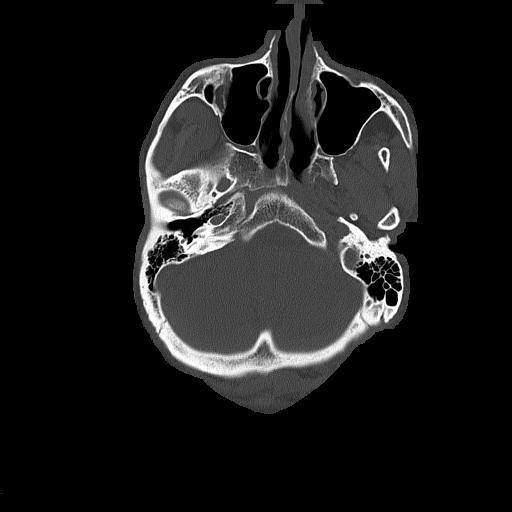
[im 23/78  bone]
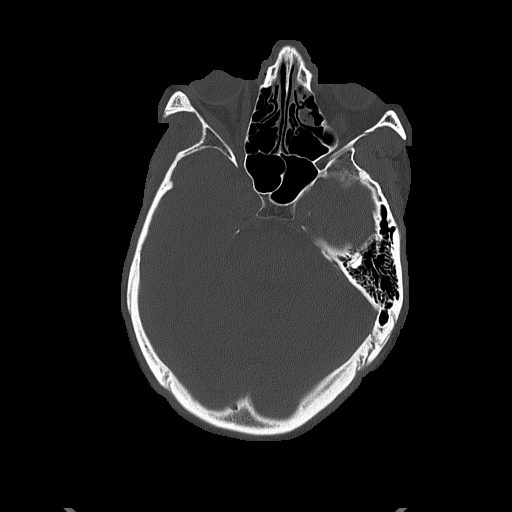
[im 34/78  bone]
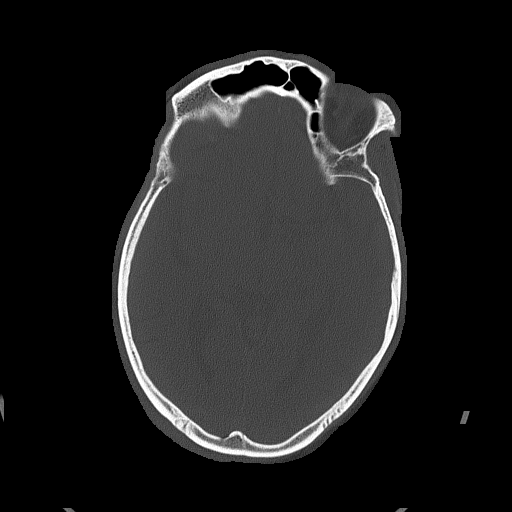
[im 45/78  bone]
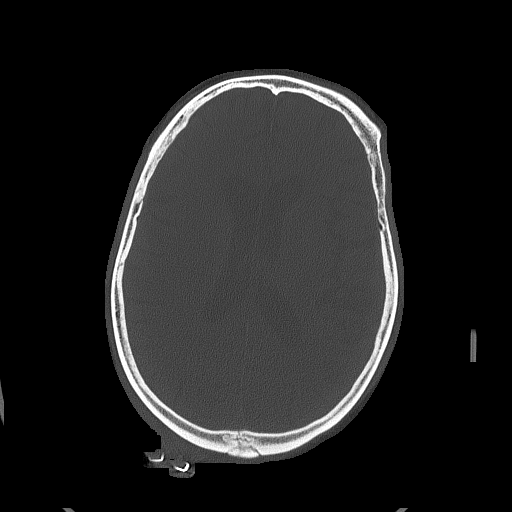
[im 56/78  bone]
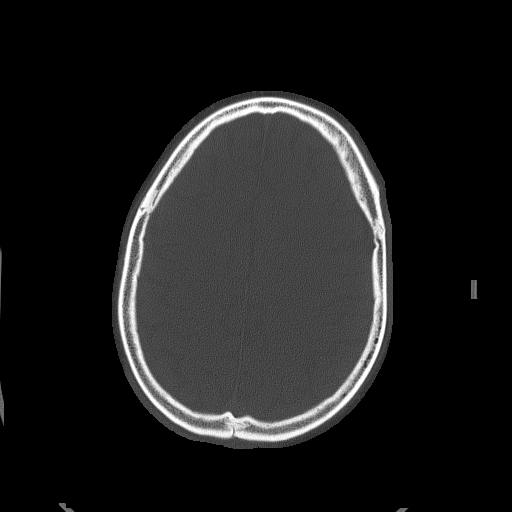
[im 67/78  bone]
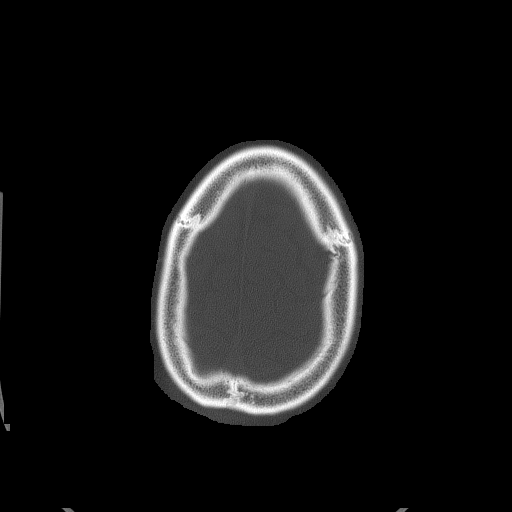

[Series 6: head without cor · coronal · non-contrast · 0.34mm/px · 3 of 78 slices shown]
[im 9/78  brain]
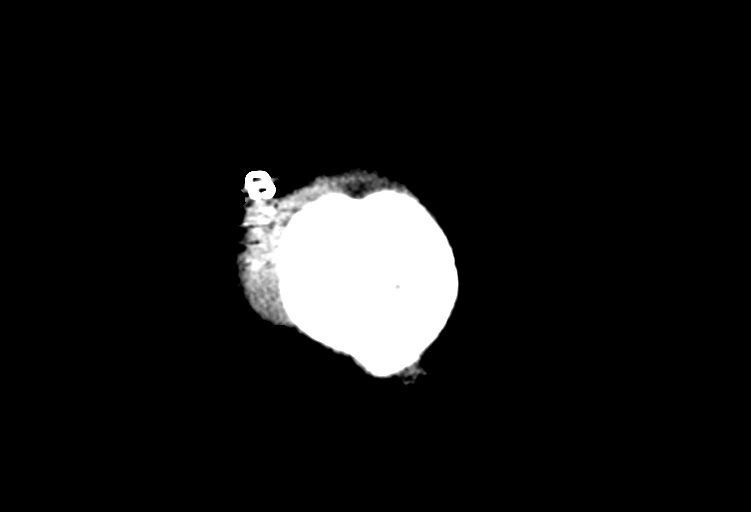
[im 12/78  brain]
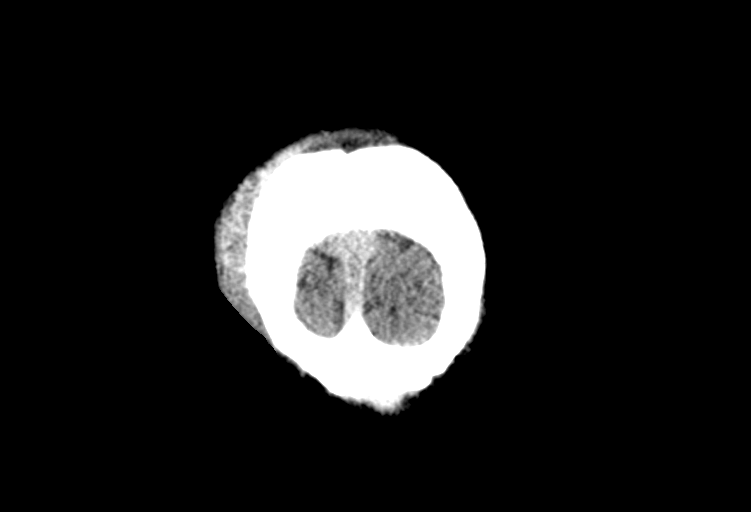
[im 15/78  brain]
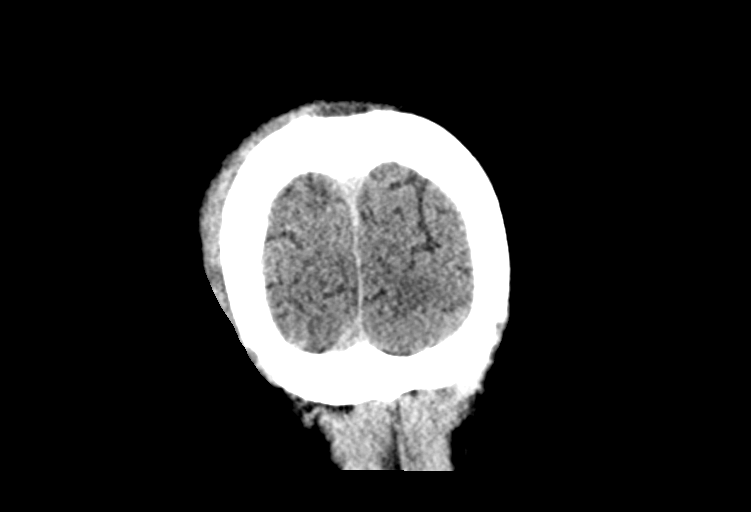

[Series 7: head without sag · sagittal · non-contrast · 0.41mm/px · 2 of 67 slices shown]
[im 23/67  brain]
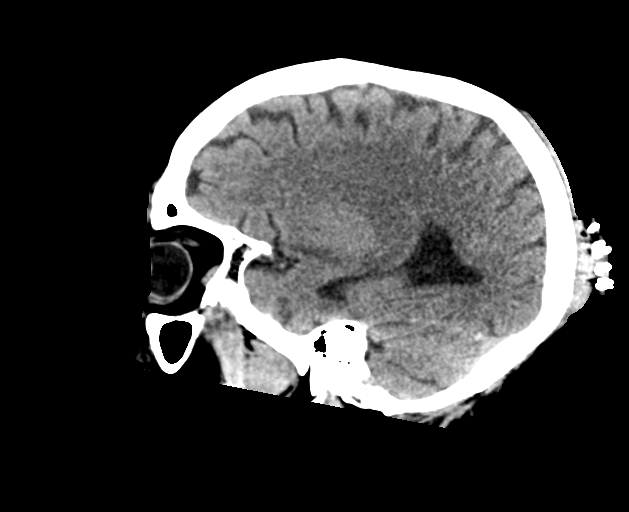
[im 45/67  brain]
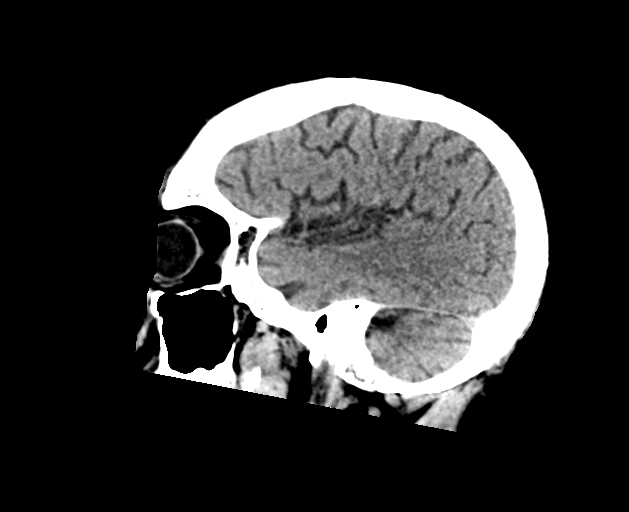

[Series 8: c_spine 2.0 st · axial · 0.38mm/px · z∈[-344,-322]mm · 2 of 97 slices shown]
[im 11/97  brain]
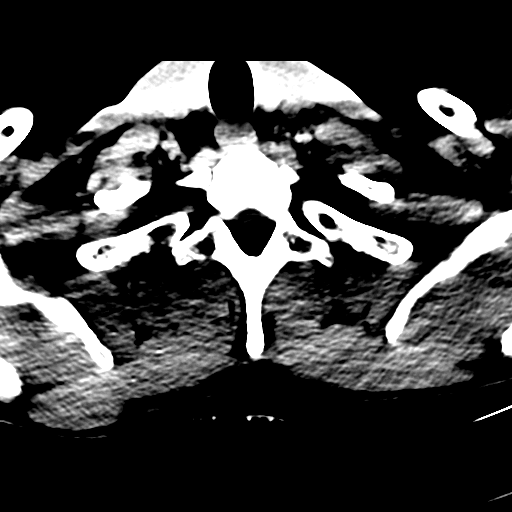
[im 22/97  brain]
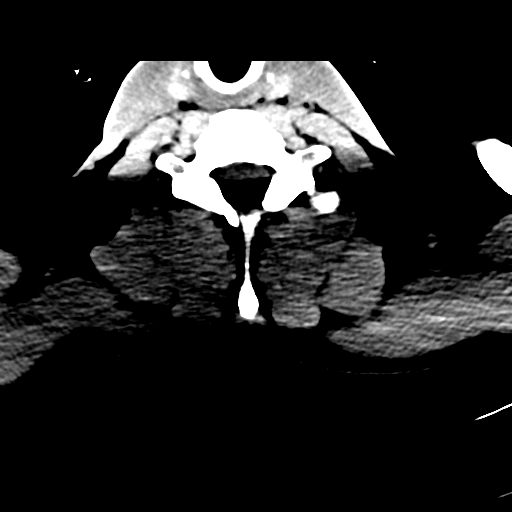

[16 of 47 positions shown; findings below may reference images not displayed]

FINDINGS: CT HEAD FINDINGS

Brain: No evidence of acute infarction, hemorrhage, hydrocephalus,
extra-axial collection or mass lesion/mass effect.

Vascular: No hyperdense vessel or unexpected calcification.

Skull: Prior nasal bone fractures are seen with mild deviation to
the right stable from the prior exam. No acute fractures noted.

Sinuses/Orbits: No acute finding.

Other: Right posterior parietal hematoma is noted. Surgical staples
are seen consistent with the recent laceration.

CT CERVICAL SPINE FINDINGS

Alignment: Normal.

Skull base and vertebrae: No acute fracture. No primary bone lesion
or focal pathologic process.

Soft tissues and spinal canal: No prevertebral fluid or swelling. No
visible canal hematoma.

Disc levels:  No acute disc pathology is noted.

Upper chest: Within normal limits.
IMPRESSION: CT of the head:  Right posterior parietal scalp hematoma.

Old nasal bone fractures.

No acute intracranial abnormality is noted.

CT of cervical spine:  No acute abnormality noted.

## 2018-10-30 DEATH — deceased
